# Patient Record
Sex: Male | Born: 1995 | Race: White | Hispanic: No | Marital: Single
Health system: Midwestern US, Academic
[De-identification: ages and names within clinical notes are randomized; demographics above are authoritative.]

## PROBLEM LIST (undated history)

## (undated) DIAGNOSIS — F419 Anxiety disorder, unspecified: Secondary | ICD-10-CM

## (undated) DIAGNOSIS — S060X9A Concussion with loss of consciousness of unspecified duration, initial encounter: Secondary | ICD-10-CM

## (undated) DIAGNOSIS — T7840XA Allergy, unspecified, initial encounter: Secondary | ICD-10-CM

## (undated) DIAGNOSIS — S060XAA Concussion with loss of consciousness status unknown, initial encounter: Secondary | ICD-10-CM

## (undated) HISTORY — DX: Concussion with loss of consciousness of unspecified duration, initial encounter: S06.0X9A

## (undated) HISTORY — DX: Allergy, unspecified, initial encounter: T78.40XA

## (undated) HISTORY — DX: Concussion with loss of consciousness status unknown, initial encounter: S06.0XAA

## (undated) HISTORY — PX: NO PAST SURGERIES: SHX2092

---
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
## 2017-05-30 ENCOUNTER — Encounter: Payer: Self-pay | Admitting: Physician Assistant

## 2017-05-30 ENCOUNTER — Ambulatory Visit (INDEPENDENT_AMBULATORY_CARE_PROVIDER_SITE_OTHER): Payer: BLUE CROSS/BLUE SHIELD | Admitting: Physician Assistant

## 2017-05-30 VITALS — BP 102/60 | HR 73 | Temp 99.0°F | Resp 14 | Ht 70.0 in | Wt 141.0 lb

## 2017-05-30 DIAGNOSIS — Z23 Encounter for immunization: Secondary | ICD-10-CM

## 2017-05-30 DIAGNOSIS — Z91010 Allergy to peanuts: Secondary | ICD-10-CM

## 2017-05-30 DIAGNOSIS — Z0000 Encounter for general adult medical examination without abnormal findings: Secondary | ICD-10-CM | POA: Insufficient documentation

## 2017-05-30 HISTORY — DX: Encounter for general adult medical examination without abnormal findings: Z00.00

## 2017-05-30 HISTORY — DX: Allergy to peanuts: Z91.010

## 2017-05-30 MED ORDER — EPINEPHRINE 0.3 MG/0.3ML IJ SOAJ: 0.3000 mg | Freq: Once | INTRAMUSCULAR | 1 refills | Status: AC

## 2017-05-30 NOTE — Progress Notes (Signed)
Patient presents to clinic today for annual exam.  Patient is fasting for labs.  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 Has an Epi Pen that is expired. Patient is requesting new Rx.  Chronic Issues: Generalized Anxiety Disorder Sleeping well. Notes some decrease in appetite with anxiety. Denies chest pain, palpitations, lightheadedness, SOB. Denies panic attack. Denies depressed mood but endorses mild anhedonia. Denies SI/HI.  Health Maintenance: Immunizations -- Mother has records. Will review today and abstract into chart.   Past Medical History:  Diagnosis Date  . Allergy   . Concussion     Past Surgical History:  Procedure Laterality Date  . NO PAST SURGERIES      No current outpatient prescriptions on file prior to visit.   No current facility-administered medications on file prior to visit.     Allergies  Allergen Reactions  . Peanut Oil Anaphylaxis    All tree nuts  . Other Other (See Comments)    Animal saliva, tree nuts-unknown reaction    Family History  Problem Relation Age of Onset  . Healthy Mother   . Hyperlipidemia Father   . Heart attack Maternal Grandmother   . Cancer Maternal Grandfather        Leukemia    Social History   Social History  . Marital status: Single    Spouse name: N/A  . Number of children: N/A  . Years of education: N/A   Occupational History  . Student     [employer]   Social History Main Topics  . Smoking status: Never Smoker  . Smokeless tobacco: Never Used  . Alcohol use No  . Drug use: No  . Sexual activity: Yes    Partners: Female    Birth control/ protection: Condom   Other Topics Concern  . Not on file   Social History Narrative  . No narrative on file   Review of Systems  Constitutional: Negative for fever and weight loss.  HENT: Negative for ear discharge, ear pain, hearing loss and tinnitus.   Eyes: Negative for blurred vision, double vision, photophobia and pain.    Respiratory: Negative for cough and shortness of breath.   Cardiovascular: Negative for chest pain and palpitations.  Gastrointestinal: Negative for abdominal pain, blood in stool, constipation, diarrhea, heartburn, melena, nausea and vomiting.  Genitourinary: Negative for dysuria, flank pain, frequency, hematuria and urgency.  Musculoskeletal: Negative for falls.  Neurological: Negative for dizziness, loss of consciousness and headaches.  Endo/Heme/Allergies: Negative for environmental allergies.  Psychiatric/Behavioral: Negative for depression, hallucinations, substance abuse and suicidal ideas. The patient is not nervous/anxious and does not have insomnia.     BP 102/60   Pulse 73   Temp 99 F (37.2 C) (Oral)   Resp 14   Ht 5\' 10"  (1.778 m)   Wt 141 lb (64 kg)   SpO2 98%   BMI 20.23 kg/m   Physical Exam  Constitutional: He is oriented to person, place, and time and well-developed, well-nourished, and in no distress.  HENT:  Head: Normocephalic and atraumatic.  Right Ear: External ear normal.  Left Ear: External ear normal.  Nose: Nose normal.  Mouth/Throat: Oropharynx is clear and moist. No oropharyngeal exudate.  Eyes: Pupils are equal, round, and reactive to light. Conjunctivae and EOM are normal.  Neck: Neck supple. No thyromegaly present.  Cardiovascular: Normal rate, regular rhythm, normal heart sounds and intact distal pulses.   Pulmonary/Chest: Effort normal and breath sounds normal. No respiratory distress. He has  no wheezes. He has no rales. He exhibits no tenderness.  Abdominal: Soft. Bowel sounds are normal. He exhibits no distension and no mass. There is no tenderness. There is no rebound and no guarding.  Genitourinary: Testes/scrotum normal and penis normal. No discharge found.  Lymphadenopathy:    He has no cervical adenopathy.  Neurological: He is alert and oriented to person, place, and time.  Skin: Skin is warm and dry. No rash noted.  Psychiatric: Affect  normal.  Vitals reviewed.  Assessment/Plan: Visit for preventive health examination Depression screen negative. Health Maintenance reviewed -- Immunizations abstracted. Due for Meningitis booster. Menveo given today. Also started Gardasil 9 injections today. . Preventive schedule discussed and handout given in AVS. Will obtain fasting labs today.   Peanut allergy Rx Epi Pen given.    Piedad ClimesMartin, Margi Edmundson Cody, PA-C

## 2017-05-30 NOTE — Assessment & Plan Note (Signed)
Rx Epi Pen given.

## 2017-05-30 NOTE — Patient Instructions (Signed)
Please stay well-hydrated and get plenty for rest. Eat a well-balanced diet. Keep up with exercise regimen.  Please use the handout given to call and schedule a counseling appointment. I feel this will be very beneficial to you.   We will follow-up in 3 months for reassessment of mood with counseling. If you feel you want to start a medication, please come see me sooner.    Preventive Care 18-39 Years, Male Preventive care refers to lifestyle choices and visits with your health care provider that can promote health and wellness. What does preventive care include?  A yearly physical exam. This is also called an annual well check.  Dental exams once or twice a year.  Routine eye exams. Ask your health care provider how often you should have your eyes checked.  Personal lifestyle choices, including: ? Daily care of your teeth and gums. ? Regular physical activity. ? Eating a healthy diet. ? Avoiding tobacco and drug use. ? Limiting alcohol use. ? Practicing safe sex. What happens during an annual well check? The services and screenings done by your health care provider during your annual well check will depend on your age, overall health, lifestyle risk factors, and family history of disease. Counseling Your health care provider may ask you questions about your:  Alcohol use.  Tobacco use.  Drug use.  Emotional well-being.  Home and relationship well-being.  Sexual activity.  Eating habits.  Work and work Statistician.  Screening You may have the following tests or measurements:  Height, weight, and BMI.  Blood pressure.  Lipid and cholesterol levels. These may be checked every 5 years starting at age 68.  Diabetes screening. This is done by checking your blood sugar (glucose) after you have not eaten for a while (fasting).  Skin check.  Hepatitis C blood test.  Hepatitis B blood test.  Sexually transmitted disease (STD) testing.  Discuss your test  results, treatment options, and if necessary, the need for more tests with your health care provider. Vaccines Your health care provider may recommend certain vaccines, such as:  Influenza vaccine. This is recommended every year.  Tetanus, diphtheria, and acellular pertussis (Tdap, Td) vaccine. You may need a Td booster every 10 years.  Varicella vaccine. You may need this if you have not been vaccinated.  HPV vaccine. If you are 4 or younger, you may need three doses over 6 months.  Measles, mumps, and rubella (MMR) vaccine. You may need at least one dose of MMR.You may also need a second dose.  Pneumococcal 13-valent conjugate (PCV13) vaccine. You may need this if you have certain conditions and have not been vaccinated.  Pneumococcal polysaccharide (PPSV23) vaccine. You may need one or two doses if you smoke cigarettes or if you have certain conditions.  Meningococcal vaccine. One dose is recommended if you are age 59-21 years and a first-year college student living in a residence hall, or if you have one of several medical conditions. You may also need additional booster doses.  Hepatitis A vaccine. You may need this if you have certain conditions or if you travel or work in places where you may be exposed to hepatitis A.  Hepatitis B vaccine. You may need this if you have certain conditions or if you travel or work in places where you may be exposed to hepatitis B.  Haemophilus influenzae type b (Hib) vaccine. You may need this if you have certain risk factors.  Talk to your health care provider about which screenings and  vaccines you need and how often you need them. This information is not intended to replace advice given to you by your health care provider. Make sure you discuss any questions you have with your health care provider. Document Released: 12/27/2001 Document Revised: 07/20/2016 Document Reviewed: 09/01/2015 Elsevier Interactive Patient Education  2017 [employer].

## 2017-05-30 NOTE — Assessment & Plan Note (Signed)
Depression screen negative. Health Maintenance reviewed -- Immunizations abstracted. Due for Meningitis booster. Menveo given today. Also started Gardasil 9 injections today. . Preventive schedule discussed and handout given in AVS. Will obtain fasting labs today.

## 2017-05-30 NOTE — Addendum Note (Signed)
Addended by: Con MemosMOORE, Millianna Szymborski S on: 05/30/2017 02:51 PM   Modules accepted: Orders

## 2017-05-30 NOTE — Progress Notes (Signed)
Pre visit review using our clinic review tool, if applicable. No additional management support is needed unless otherwise documented below in the visit note. 

## 2017-05-31 ENCOUNTER — Encounter: Payer: Self-pay | Admitting: Physician Assistant

## 2017-08-07 ENCOUNTER — Ambulatory Visit (INDEPENDENT_AMBULATORY_CARE_PROVIDER_SITE_OTHER): Payer: BLUE CROSS/BLUE SHIELD | Admitting: *Deleted

## 2017-08-07 DIAGNOSIS — Z23 Encounter for immunization: Secondary | ICD-10-CM

## 2018-01-19 ENCOUNTER — Ambulatory Visit: Payer: BLUE CROSS/BLUE SHIELD | Admitting: Physician Assistant

## 2018-01-19 ENCOUNTER — Encounter: Payer: Self-pay | Admitting: Physician Assistant

## 2018-01-19 ENCOUNTER — Other Ambulatory Visit: Payer: Self-pay

## 2018-01-19 VITALS — BP 100/70 | HR 86 | Temp 98.2°F | Resp 16 | Ht 70.0 in | Wt 138.0 lb

## 2018-01-19 DIAGNOSIS — R079 Chest pain, unspecified: Secondary | ICD-10-CM | POA: Diagnosis not present

## 2018-01-19 DIAGNOSIS — R0789 Other chest pain: Secondary | ICD-10-CM

## 2018-01-19 LAB — COMPREHENSIVE METABOLIC PANEL
ALK PHOS: 68 U/L (ref 52–171)
ALT: 8 U/L (ref 0–53)
AST: 14 U/L (ref 0–37)
Albumin: 5 g/dL (ref 3.5–5.2)
BUN: 10 mg/dL (ref 6–23)
CALCIUM: 10.3 mg/dL (ref 8.4–10.5)
CO2: 30 mEq/L (ref 19–32)
Chloride: 103 mEq/L (ref 96–112)
Creatinine, Ser: 0.9 mg/dL (ref 0.40–1.50)
GFR: 114.64 mL/min (ref >60.00)
Glucose, Bld: 93 mg/dL (ref 70–99)
Potassium: 4.4 mEq/L (ref 3.5–5.1)
Sodium: 139 mEq/L (ref 135–145)
TOTAL PROTEIN: 7.1 g/dL (ref 6.0–8.3)
Total Bilirubin: 0.6 mg/dL (ref 0.2–1.2)

## 2018-01-19 LAB — LIPASE: Lipase: 11 U/L (ref 11.0–59.0)

## 2018-01-19 LAB — TROPONIN I: TNIDX: 0 ug/l (ref 0.00–0.06)

## 2018-01-19 MED ORDER — GI COCKTAIL ~~LOC~~
30.0000 mL | Freq: Once | ORAL | Status: AC
  Administered 2018-01-19: 30 mL via ORAL

## 2018-01-19 MED ORDER — NAPROXEN 500 MG PO TABS
500.0000 mg | ORAL_TABLET | Freq: Two times a day (BID) | ORAL | 0 refills | Status: DC
Start: 2018-01-19 — End: 2018-08-08

## 2018-01-19 NOTE — Patient Instructions (Signed)
Please go to the lab today for blood work.  I will call you with your results. We will alter treatment regimen(s) if indicated by your results.   Your EKG looks good. You are young and healthy so there is a very low risk of this being related to your heart. Your heart and lungs sound great on examination. Symptoms seem a combination of some inflammation in the chest wall and anxiety. I want you to hydrate and rest.  Take the Naproxen as directed with food for inflammation. Avoid heavy foods or late-night eating.   Symptoms should calm down over the next day or so. Avoid heavy lifting.  If you note any worsening symptoms or shortness of breath, racing heart, please go to the ER even if our workup is negative.  Follow-up with me on Monday for reassessment.

## 2018-01-19 NOTE — Progress Notes (Signed)
Patient presents to clinic today c/o sternal chest pain starting this morning, described as aching but sharp with a deep breath. Denies SOB, palpitations, LH or dizziness. Noted some tingling in his L arm earlier that has resolved. Is having some tingling in hands intermittently. Denies nausea/vomiting or heart burn. Does note eating chinese late last night. Has been doing a lot of heavy lifting at work over the past few days. Patient does note increased stress and some anxiety recently after the birth of his child.   Past Medical History:  Diagnosis Date  . Allergy   . Concussion     Current Outpatient Medications on File Prior to Visit  Medication Sig Dispense Refill  . loratadine (CLARITIN) 10 MG tablet Take 10 mg by mouth daily.    . Multiple Vitamin (MULTIVITAMIN) tablet Take 1 tablet by mouth daily.     No current facility-administered medications on file prior to visit.     Allergies  Allergen Reactions  . Peanut Oil Anaphylaxis    All tree nuts  . Other Other (See Comments)    Animal saliva, tree nuts-unknown reaction    Family History  Problem Relation Age of Onset  . Healthy Mother   . Hyperlipidemia Father   . Heart attack Maternal Grandmother   . Cancer Maternal Grandfather        Leukemia    Social History   Socioeconomic History  . Marital status: Single    Spouse name: None  . Number of children: None  . Years of education: None  . Highest education level: None  Social Needs  . Financial resource strain: None  . Food insecurity - worry: None  . Food insecurity - inability: None  . Transportation needs - medical: None  . Transportation needs - non-medical: None  Occupational History  . Occupation: Student    Comment: [employer]  Tobacco Use  . Smoking status: Never Smoker  . Smokeless tobacco: Never Used  Substance and Sexual Activity  . Alcohol use: No  . Drug use: No  . Sexual activity: Yes    Partners: Female    Birth control/protection: Condom   Other Topics Concern  . None  Social History Narrative  . None   Review of Systems - See HPI.  All other ROS are negative.  BP 100/70   Pulse 86   Temp 98.2 F (36.8 C) (Oral)   Resp 16   Ht _0  (1.778 m)   Wt 138 lb (62.6 kg)   SpO2 97%   BMI 19.80 kg/m   Physical Exam  Constitutional: He is oriented to person, place, and time and well-developed, well-nourished, and in no distress.  HENT:  Head: Normocephalic and atraumatic.  Right Ear: External ear normal.  Left Ear: External ear normal.  Nose: Nose normal.  Mouth/Throat: Oropharynx is clear and moist.  Eyes: Conjunctivae are normal.  Neck: Neck supple.  Cardiovascular: Normal rate, regular rhythm, normal heart sounds and intact distal pulses.  Pulmonary/Chest: Effort normal and breath sounds normal. No respiratory distress. He has no wheezes. He has no rales. He exhibits tenderness.  Abdominal: Soft. Bowel sounds are normal. He exhibits no distension and no mass. There is no tenderness. There is no rebound and no guarding.  Neurological: He is alert and oriented to person, place, and time.  Skin: Skin is warm and dry. No rash noted.  Psychiatric: Affect normal.  Vitals reviewed.  Assessment/Plan: 1. Atypical chest pain EKG with NSR. GI Cocktail without improvement.  Chest tenderness on exam. Seems combination of MSK and anxiety. Patient healthy 22 year old. Low risk for ACS. Will check labs as listed below. Will start Naproxen. Stress relief tactics reviewed. Follow-up scheduled.  Strict ER precautions given to patient.  - EKG 12-Lead - gi cocktail (Maalox,Lidocaine,Donnatal) - Troponin I - Lipase - Comp Met (CMET) - naproxen (NAPROSYN) 500 MG tablet; Take 1 tablet (500 mg total) by mouth 2 (two) times daily with a meal.  Dispense: 10 tablet; Refill: 0   Leeanne Rio, PA-C

## 2018-01-22 ENCOUNTER — Other Ambulatory Visit: Payer: Self-pay

## 2018-01-22 ENCOUNTER — Encounter: Payer: Self-pay | Admitting: Physician Assistant

## 2018-01-22 ENCOUNTER — Ambulatory Visit: Payer: BLUE CROSS/BLUE SHIELD | Admitting: Physician Assistant

## 2018-01-22 VITALS — BP 106/78 | HR 89 | Temp 98.2°F | Resp 14 | Ht 70.0 in | Wt 128.0 lb

## 2018-01-22 DIAGNOSIS — F411 Generalized anxiety disorder: Secondary | ICD-10-CM | POA: Diagnosis not present

## 2018-01-22 HISTORY — DX: Generalized anxiety disorder: F41.1

## 2018-01-22 MED ORDER — ESCITALOPRAM OXALATE 10 MG PO TABS: 10.0000 mg | ORAL_TABLET | Freq: Every day | ORAL | 1 refills | Status: DC

## 2018-01-22 MED ORDER — ALPRAZOLAM 0.25 MG PO TABS: 0.2500 mg | ORAL_TABLET | Freq: Two times a day (BID) | ORAL | 0 refills | Status: DC | PRN

## 2018-01-22 NOTE — Patient Instructions (Signed)
Please go to the lab today for blood work.  I will call you with your results. We will alter treatment regimen(s) if indicated by your results.   Please start the Lexapro, taking 1/2 tablet daily x 3-4 days before increasing to 10 mg (1 tablet) daily.  Use the handout given to schedule an appointment with counseling.  Use the Xanax as directed if needed for panic attack. To be used sparingly. Download the [employer] app on your phone to do some mindfulness training techniques.   Follow-up in 1 month. Return sooner if needed. If you note any worsening symptoms, call or come see me ASAP. ER if you ever have any thoughts of harming yourself or others.

## 2018-01-22 NOTE — Assessment & Plan Note (Signed)
Significant with panic attacks. Counseling recommended. Handout given. Medication discussion was had with patient. Will start Lexapro 10 mg daily. Xanax 0.25 mg PRN for severe acute anxiety. Will check TSH today. Close follow-up scheduled.

## 2018-01-22 NOTE — Progress Notes (Signed)
Patient presents to clinic today for follow-up of atypical chest pain and anxiety. Patient endorses chest pain described at last visit has resolved. Does note chest pressure and tightness when he is getting very anxious. Notes racing thoughts at night, constantly worrying about things. Is affecting sleep and appetite. Denies depressed mood but notes some anhedonia. Denies SI/HI. States that truly he has been dealing with these issues for the past 2 years. Notes + family history of anxiety and depression. Has never been treated. Again workup at last visit including CBC, CMP, Lipase, Troponin and EKG all unremarkable.  Past Medical History:  Diagnosis Date  . Allergy   . Concussion     Current Outpatient Medications on File Prior to Visit  Medication Sig Dispense Refill  . loratadine (CLARITIN) 10 MG tablet Take 10 mg by mouth daily.    . Multiple Vitamin (MULTIVITAMIN) tablet Take 1 tablet by mouth daily.    . naproxen (NAPROSYN) 500 MG tablet Take 1 tablet (500 mg total) by mouth 2 (two) times daily with a meal. 10 tablet 0   No current facility-administered medications on file prior to visit.     Allergies  Allergen Reactions  . Peanut Oil Anaphylaxis    All tree nuts  . Other Other (See Comments)    Animal saliva, tree nuts-unknown reaction    Family History  Problem Relation Age of Onset  . Healthy Mother   . Hyperlipidemia Father   . Heart attack Maternal Grandmother   . Cancer Maternal Grandfather        Leukemia    Social History   Socioeconomic History  . Marital status: Single    Spouse name: None  . Number of children: None  . Years of education: None  . Highest education level: None  Social Needs  . Financial resource strain: None  . Food insecurity - worry: None  . Food insecurity - inability: None  . Transportation needs - medical: None  . Transportation needs - non-medical: None  Occupational History  . Occupation: Student    Comment: [employer]  Tobacco  Use  . Smoking status: Never Smoker  . Smokeless tobacco: Never Used  Substance and Sexual Activity  . Alcohol use: No  . Drug use: No  . Sexual activity: Yes    Partners: Female    Birth control/protection: Condom  Other Topics Concern  . None  Social History Narrative  . None   Review of Systems - See HPI.  All other ROS are negative.  BP 106/78   Pulse 89   Temp 98.2 F (36.8 C) (Oral)   Resp 14   Ht _0  (1.778 m)   Wt 128 lb (58.1 kg)   SpO2 98%   BMI 18.37 kg/m   Physical Exam  Constitutional: He is well-developed, well-nourished, and in no distress.  HENT:  Head: Normocephalic and atraumatic.  Eyes: Conjunctivae are normal.  Neck: Neck supple.  Cardiovascular: Normal rate, regular rhythm, normal heart sounds and intact distal pulses.  Pulmonary/Chest: Effort normal and breath sounds normal. No respiratory distress. He has no wheezes. He has no rales. He exhibits no tenderness.  Skin: Skin is warm and dry. No rash noted.  Psychiatric: His mood appears anxious. He does not exhibit a depressed mood. He expresses no homicidal and no suicidal ideation. He expresses no suicidal plans and no homicidal plans.  Vitals reviewed.  Recent Results (from the past 2160 hour(s))  Troponin I     Status: None  Collection Time: 01/19/18 10:40 AM  Result Value Ref Range   TNIDX 0.00 0.00 - 0.06 ug/l  Lipase     Status: None   Collection Time: 01/19/18 10:40 AM  Result Value Ref Range   Lipase 11.0 11.0 - 59.0 U/L  Comp Met (CMET)     Status: None   Collection Time: 01/19/18 10:40 AM  Result Value Ref Range   Sodium 139 135 - 145 mEq/L   Potassium 4.4 3.5 - 5.1 mEq/L   Chloride 103 96 - 112 mEq/L   CO2 30 19 - 32 mEq/L   Glucose, Bld 93 70 - 99 mg/dL   BUN 10 6 - 23 mg/dL   Creatinine, Ser 0.90 0.40 - 1.50 mg/dL   Total Bilirubin 0.6 0.2 - 1.2 mg/dL   Alkaline Phosphatase 68 52 - 171 U/L   AST 14 0 - 37 U/L   ALT 8 0 - 53 U/L   Total Protein 7.1 6.0 - 8.3 g/dL    Albumin 5.0 3.5 - 5.2 g/dL   Calcium 10.3 8.4 - 10.5 mg/dL   GFR 114.64 >60.00 mL/min    Assessment/Plan: Generalized anxiety disorder Significant with panic attacks. Counseling recommended. Handout given. Medication discussion was had with patient. Will start Lexapro 10 mg daily. Xanax 0.25 mg PRN for severe acute anxiety. Will check TSH today. Close follow-up scheduled.     Leeanne Rio, PA-C

## 2018-01-23 LAB — TSH: TSH: 0.75 u[IU]/mL (ref 0.40–5.00)

## 2018-01-25 ENCOUNTER — Emergency Department (HOSPITAL_COMMUNITY)
Admission: EM | Admit: 2018-01-25 | Discharge: 2018-01-25 | Discharge status: Against Medical Advice | Payer: BLUE CROSS/BLUE SHIELD | Source: Home / Self Care

## 2018-01-25 ENCOUNTER — Encounter (HOSPITAL_BASED_OUTPATIENT_CLINIC_OR_DEPARTMENT_OTHER): Payer: Self-pay

## 2018-01-25 ENCOUNTER — Emergency Department (HOSPITAL_BASED_OUTPATIENT_CLINIC_OR_DEPARTMENT_OTHER): Payer: BLUE CROSS/BLUE SHIELD

## 2018-01-25 ENCOUNTER — Ambulatory Visit: Payer: Self-pay | Admitting: *Deleted

## 2018-01-25 ENCOUNTER — Emergency Department (HOSPITAL_BASED_OUTPATIENT_CLINIC_OR_DEPARTMENT_OTHER)
Admission: EM | Admit: 2018-01-25 | Discharge: 2018-01-25 | Disposition: A | Discharge status: Home / Self Care | Payer: BLUE CROSS/BLUE SHIELD | Attending: Emergency Medicine | Admitting: Emergency Medicine

## 2018-01-25 ENCOUNTER — Other Ambulatory Visit: Payer: Self-pay

## 2018-01-25 DIAGNOSIS — R0789 Other chest pain: Secondary | ICD-10-CM | POA: Diagnosis not present

## 2018-01-25 DIAGNOSIS — R509 Fever, unspecified: Secondary | ICD-10-CM | POA: Diagnosis not present

## 2018-01-25 DIAGNOSIS — Z79899 Other long term (current) drug therapy: Secondary | ICD-10-CM | POA: Diagnosis not present

## 2018-01-25 DIAGNOSIS — R079 Chest pain, unspecified: Secondary | ICD-10-CM | POA: Diagnosis not present

## 2018-01-25 DIAGNOSIS — F419 Anxiety disorder, unspecified: Secondary | ICD-10-CM

## 2018-01-25 DIAGNOSIS — Z91010 Allergy to peanuts: Secondary | ICD-10-CM | POA: Diagnosis not present

## 2018-01-25 DIAGNOSIS — F17200 Nicotine dependence, unspecified, uncomplicated: Secondary | ICD-10-CM | POA: Diagnosis not present

## 2018-01-25 HISTORY — DX: Anxiety disorder, unspecified: F41.9

## 2018-01-25 LAB — CBC
HCT: 42.9 % (ref 39.0–52.0)
HEMOGLOBIN: 15.4 g/dL (ref 13.0–17.0)
MCH: 29.7 pg (ref 26.0–34.0)
MCHC: 35.9 g/dL (ref 30.0–36.0)
MCV: 82.7 fL (ref 78.0–100.0)
Platelets: 307 10*3/uL (ref 150–400)
RBC: 5.19 MIL/uL (ref 4.22–5.81)
RDW: 12.2 % (ref 11.5–15.5)
WBC: 9.4 10*3/uL (ref 4.0–10.5)

## 2018-01-25 LAB — BASIC METABOLIC PANEL
ANION GAP: 13 (ref 5–15)
BUN: 21 mg/dL — ABNORMAL HIGH (ref 6–20)
CALCIUM: 9.8 mg/dL (ref 8.9–10.3)
CO2: 20 mmol/L — AB (ref 22–32)
Chloride: 102 mmol/L (ref 101–111)
Creatinine, Ser: 0.92 mg/dL (ref 0.61–1.24)
GFR calc Af Amer: >60 mL/min (ref >60)
GFR calc non Af Amer: >60 mL/min (ref >60)
GLUCOSE: 94 mg/dL (ref 65–99)
Potassium: 3.7 mmol/L (ref 3.5–5.1)
Sodium: 135 mmol/L (ref 135–145)

## 2018-01-25 LAB — TROPONIN I: Troponin I: <0.03 ng/mL (ref <0.03)

## 2018-01-25 MED ORDER — KETOROLAC TROMETHAMINE 30 MG/ML IJ SOLN
30.0000 mg | Freq: Once | INTRAMUSCULAR | Status: AC
  Administered 2018-01-25: 30 mg via INTRAVENOUS
  Filled 2018-01-25: qty 1

## 2018-01-25 MED ORDER — SODIUM CHLORIDE 0.9 % IV BOLUS (SEPSIS)
500.0000 mL | Freq: Once | INTRAVENOUS | Status: AC
  Administered 2018-01-25: 500 mL via INTRAVENOUS

## 2018-01-25 MED ORDER — ONDANSETRON HCL 4 MG PO TABS: 4.0000 mg | ORAL_TABLET | Freq: Three times a day (TID) | ORAL | 0 refills | Status: DC | PRN

## 2018-01-25 NOTE — ED Provider Notes (Signed)
[facility] EMERGENCY DEPARTMENT Provider Note   CSN: [id number] Arrival date & time: 01/25/18  1521     History   Chief Complaint Chief Complaint  Patient presents with  . Chest Pain    HPI Jordan Carroll is a 22 y.o. male with a past medical history of anxiety, who presents to ED for evaluation of centralized chest pain, feelings of worsening anxiety and like his heart is "racing" for the past 6 days.  Symptoms began after he smoked a "cartilage" which is basically the wax form of marijuana.  He smoked this in the past with no symptoms.  He was seen and evaluated by his PCP when symptoms began with negative workup and was started on Lexapro to help with what appeared to be anxiety.  However, he tells me despite the use of Lexapro he continues to have symptoms.  The chest pain has been constant and does not radiate.  Described as sharp.  He does tell me that the chest pain is causing him to be more anxious and have worsening of his panic attacks where his "entire body goes numb."  He is not taking any medications prior to arrival to help with symptoms.  He does report fever of 102 today which resolved spontaneously.  Denies any cough, hemoptysis, recent surgeries, recent prolonged travel, leg swelling, hormone use, prior DVT, PE, MI, family history of cardiac disease at a young age, fever.  He reports occasional marijuana use, juul use but denies alcohol use.  HPI  Past Medical History:  Diagnosis Date  . Allergy   . Anxiety   . Concussion     Patient Active Problem List   Diagnosis Date Noted  . Generalized anxiety disorder 01/22/2018  . Visit for preventive health examination 05/30/2017  . Peanut allergy 05/30/2017  . 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 NO PAST SURGERIES         Home Medications    Prior to Admission medications   Medication Sig Start Date End Date Taking? Authorizing Provider  ALPRAZolam  (XANAX) 0.25 MG tablet Take 1 tablet (0.25 mg total) by mouth 2 (two) times daily as needed for anxiety. 01/22/18   Waldon Merl, PA-C  escitalopram (LEXAPRO) 10 MG tablet Take 1 tablet (10 mg total) by mouth daily. 01/22/18   Waldon Merl, PA-C  loratadine (CLARITIN) 10 MG tablet Take 10 mg by mouth daily.    [provider]  Multiple Vitamin (MULTIVITAMIN) tablet Take 1 tablet by mouth daily.    [provider]  naproxen (NAPROSYN) 500 MG tablet Take 1 tablet (500 mg total) by mouth 2 (two) times daily with a meal. 01/19/18   Waldon Merl, PA-C    Family History Family History  Problem Relation Age of Onset  . Healthy Mother   . Hyperlipidemia Father   . Heart attack Maternal Grandmother   . Cancer Maternal Grandfather        Leukemia    Social History Social History   Tobacco Use  . Smoking status: Current Every Day Smoker    Types: E-cigarettes  . Smokeless tobacco: Never Used  Substance Use Topics  . Alcohol use: Yes    Comment: occ  . Drug use: Yes    Types: Marijuana     Allergies   Peanut oil and Other   Review of Systems Review of Systems  Constitutional: Positive for fever. Negative for appetite change and  chills.  HENT: Negative for ear pain, rhinorrhea, sneezing and sore throat.   Eyes: Negative for photophobia and visual disturbance.  Respiratory: Negative for cough, chest tightness, shortness of breath and wheezing.   Cardiovascular: Positive for chest pain. Negative for palpitations.  Gastrointestinal: Negative for abdominal pain, blood in stool, constipation, diarrhea, nausea and vomiting.  Genitourinary: Negative for dysuria, hematuria and urgency.  Musculoskeletal: Negative for myalgias.  Skin: Negative for rash.  Neurological: Negative for dizziness, weakness and light-headedness.  Psychiatric/Behavioral: The patient is nervous/anxious.      Physical Exam Updated Vital Signs BP 119/74 (BP Location: Left Arm)    Pulse 90   Temp 98.5 F (36.9 C) (Oral)   Resp 16   Ht 5\' 10"  (1.778 m)   Wt 57.2 kg (126 lb 1.7 oz)   SpO2 98%   BMI 18.09 kg/m   Physical Exam  Constitutional: He appears well-developed and well-nourished. No distress.  Nontoxic-appearing and in no acute distress.  Speaking in complete sentences without difficulty.  Does appear very anxious.  HENT:  Head: Normocephalic and atraumatic.  Nose: Nose normal.  Eyes: Conjunctivae and EOM are normal. Left eye exhibits no discharge. No scleral icterus.  Neck: Normal range of motion. Neck supple.  Cardiovascular: Normal rate, regular rhythm, normal heart sounds and intact distal pulses. Exam reveals no gallop and no friction rub.  No murmur heard. Pulmonary/Chest: Effort normal and breath sounds normal. No respiratory distress.  Abdominal: Soft. Bowel sounds are normal. He exhibits no distension. There is no tenderness. There is no guarding.  Musculoskeletal: Normal range of motion. He exhibits no edema.  Neurological: He is alert. He exhibits normal muscle tone. Coordination normal.  Skin: Skin is warm and dry. No rash noted.  Psychiatric: He has a normal mood and affect.  Nursing note and vitals reviewed.    ED Treatments / Results  Labs (all labs ordered are listed, but only abnormal results are displayed) Labs Reviewed  BASIC METABOLIC PANEL - Abnormal; Notable for the following components:      Result Value   CO2 20 (*)    BUN 21 (*)    All other components within normal limits  CBC  TROPONIN I    EKG  EKG Interpretation  Date/Time:  Thursday January 25 2018 15:32:00 EDT Ventricular Rate:  100 PR Interval:  126 QRS Duration: 92 QT Interval:  362 QTC Calculation: 466 R Axis:   93 Text Interpretation:  Normal sinus rhythm Right atrial enlargement Rightward axis Pulmonary disease pattern Abnormal ECG No prior ECG for comparison.  No STEMI Confirmed by Theda Belfastegeler, Chris ([id number]) on 01/25/2018 3:41:42 PM        Radiology Dg Chest 2 View  Result Date: 01/25/2018 CLINICAL DATA:  Chest pain for 7 days. EXAM: CHEST - 2 VIEW COMPARISON:  None. FINDINGS: Lungs are clear. No pneumothorax or pleural effusion. Heart size is normal. No bony abnormality. IMPRESSION: Normal chest. Electronically Signed   By: Drusilla Kannerhomas  Dalessio M.D.   On: 01/25/2018 15:57    Procedures Procedures (including critical care time)  Medications Ordered in ED Medications  sodium chloride 0.9 % bolus 500 mL (0 mLs Intravenous Stopped 01/25/18 1940)  ketorolac (TORADOL) 30 MG/ML injection 30 mg (30 mg Intravenous Given 01/25/18 1857)     Initial Impression / Assessment and Plan / ED Course  I have reviewed the triage vital signs and the nursing notes.  Pertinent labs & imaging results that were available during my care of the patient were  reviewed by me and considered in my medical decision making (see chart for details).     Patient presents to ED for evaluation of 6-day history of centralized chest pain, feelings of worsening anxiety and feeling like her heart is racing.  Symptoms began after he smoked marijuana.  He was seen and evaluated by his PCP and began on Lexapro for what appeared to be anxiety.  He has been taking this for about 3 days but tells me that he has no improvement in his symptoms.  Denies any shortness of breath, hemoptysis, wheezing, leg swelling, history of DVT, PE, MI, family history of sudden cardiac death at a young age, fevers, URI symptoms, recent surgeries, recent prolonged travel.  He is afebrile with no use of antipyretics.  He is not tachycardic or tachypneic and is satting at 98% on room air without difficulty.  He does not appear dehydrated.  He does appear extremely anxious and worried.  EKG with no ischemic changes.  Troponin negative, CBC, BMP unremarkable.  Chest x-ray was negative.  He is PERC and Wells criteria negative.  He is low risk by heart score.  Patient given fluids and Toradol here with  little improvement in his symptoms.  However, I did reassure the patient that there is low suspicion for ACS, PE, any other life-threatening cardiac or pulmonary cause of his symptoms.  He is on Lexapro which could take weeks to have an effect on his symptoms.  Advised patient to continue taking Tylenol or ibuprofen as needed to help with pain and to follow-up with primary care provider for further evaluation.  I suspect that his symptoms are most likely due to his anxiety and worrying.  Patient does endorse some nausea so we will give Zofran to be taken as needed.  Patient appears stable for discharge at this time.  Strict return precautions given.  Portions of this note were generated with Scientist, clinical (histocompatibility and immunogenetics). Dictation errors may occur despite best attempts at proofreading.   Final Clinical Impressions(s) / ED Diagnoses   Final diagnoses:  Anxiety  Chest wall pain    ED Discharge Orders    None       Dietrich Pates, PA-C 01/25/18 1959    Tegeler, Canary Brim, MD 01/26/18 0005

## 2018-01-25 NOTE — Telephone Encounter (Signed)
Patient is calling to discuss the physical manifestations he is having related to his panic attack. He has started new medication and feels that he is not getting an effect yet. Patient has appointment for therapy later this month and he is talking to his pastor today. Call to office appointment scheduled with PCP for tomorrow morning. Reason for Disposition . [1] Started on anti-anxiety medication AND [2] no relief  Answer Assessment - Initial Assessment Questions 1. CONCERN: "What happened that made you call today?"     Panic attack- weight loss and palpatations 2. ANXIETY SYMPTOM SCREENING: "Can you describe how you have been feeling?"  (e.g., tense, restless, panicky, anxious, keyed up, trouble sleeping, trouble concentrating)     Patient has been diagnosed recently and started medication recently- on day 4- patient has not noticed a big difference  3. ONSET: "How long have you been feeling this way?"     Patient has had anxiety for a while- but he has just started treatment 4. RECURRENT: "Have you felt this way before?"  If yes: "What happened that time?" "What helped these feelings go away in the past?"      Patient has started to manifest symptoms- so he is seeking help 5. RISK OF HARM - SUICIDAL IDEATION:  "Do you ever have thoughts of hurting or killing yourself?"  (e.g., yes, no, no but preoccupation with thoughts about death)   - INTENT:  "Do you have thoughts of hurting or killing yourself right NOW?" (e.g., yes, no, N/A)   - PLAN: "Do you have a specific plan for how you would do this?" (e.g., gun, knife, overdose, no plan, N/A)     No- no plans to hurt self 6. RISK OF HARM - HOMICIDAL IDEATION:  "Do you ever have thoughts of hurting or killing someone else?"  (e.g., yes, no, no but preoccupation with thoughts about death)   - INTENT:  "Do you have thoughts of hurting or killing someone right NOW?" (e.g., yes, no, N/A)   - PLAN: "Do you have a specific plan for how you would do this?"  (e.g., gun, knife, no plan, N/A)      no 7. FUNCTIONAL IMPAIRMENT: "How have things been going for you overall in your life? Have you had any more difficulties than usual doing your normal daily activities?"  (e.g., better, same, worse; self-care, school, work, interactions)     New baby 8. SUPPORT: "Who is with you now?" "Who do you live with?" "Do you have family or friends nearby who you can talk to?"      parents and family- patient reports he has good support system 9. THERAPIST: "Do you have a counselor or therapist? Name?"     Has appointment for therapy- he is going to talk to pastor today 10. STRESSORS: "Has there been any new stress or recent changes in your life?"       Changes in family 8011. CAFFEINE ABUSE: "Do you drink caffeinated beverages, and how much each day?" (e.g., coffee, tea, colas)       Discussed- has not been consuming 12. SUBSTANCE ABUSE: "Do you use any illegal drugs or alcohol?"       No- patient did try smoking a "cartrage" - he felt that might have had an effect on how he felt 13. OTHER SYMPTOMS: "Do you have any other physical symptoms right now?" (e.g., chest pain, palpitations, difficulty breathing, fever)       Chest pain- comes and goes-sharp pain- weight tightening  in center of chest, not hungry back pain- body aches- neck pain 14. PREGNANCY: "Is there any chance you are pregnant?" "When was your last menstrual period?"       n/a  Protocols used: ANXIETY AND PANIC ATTACK-A-AH

## 2018-01-25 NOTE — Telephone Encounter (Signed)
Patient has checked in at the ER.

## 2018-01-25 NOTE — ED Notes (Signed)
Pt verbalizes understanding of d/c instructions and denies any further needs at this time. 

## 2018-01-25 NOTE — Discharge Instructions (Signed)
Please read attached information regarding your condition. Continue your home medications as previously prescribed. Follow-up with your primary care provider for further evaluation. Return to ED for worsening symptoms, trouble breathing or trouble swallowing, severe crushing chest pain, wheezing, coughing up blood.

## 2018-01-25 NOTE — Telephone Encounter (Signed)
Pt calling with complaints of chest pain that comes and goes since last Friday. Pt states that pain is in the middle of his chest and goes towards the left side.  Pt states that today the pain has been more constant and sometimes if feels like pressure and other times it feels like a sharp pain. Pt also has complaints of nausea and dizziness at times. Pt also stating he took his temperature and it was 102. Pt states when he looked in the mirror he could see his heart beating in the upper part of his stomach. Pt advised to seek treatment in the ED for current symptoms. Pt verbalized understanding.   Reason for Disposition . Dizziness or lightheadedness  Answer Assessment - Initial Assessment Questions 1. LOCATION: "Where does it hurt?"       Middle of chest to the left side 2. RADIATION: "Does the pain go anywhere else?" (e.g., into neck, jaw, arms, back)     No 3. ONSET: "When did the chest pain begin?" (Minutes, hours or days)      Has complaints of pain since Friday 4. PATTERN "Does the pain come and go, or has it been constant since it started?"  "Does it get worse with exertion?"      More constant today, in the past it comes and goes, worse with lying down 5. DURATION: "How long does it last" (e.g., seconds, minutes, hours)     Minutes(8/10) in the past 6. SEVERITY: "How bad is the pain?"  (e.g., Scale 1-10; mild, moderate, or severe)    - MILD (1-3): doesn't interfere with normal activities     - MODERATE (4-7): interferes with normal activities or awakens from sleep    - SEVERE (8-10): excruciating pain, unable to do any normal activities       Pain 3-4 7. CARDIAC RISK FACTORS: "Do you have any history of heart problems or risk factors for heart disease?" (e.g., prior heart attack, angina; high blood pressure, diabetes, being overweight, high cholesterol, smoking, or strong family history of heart disease)     Has smoked marijuana and cartridges, grandmother had a heart attack yearrs  ago 8. PULMONARY RISK FACTORS: "Do you have any history of lung disease?"  (e.g., blood clots in lung, asthma, emphysema, birth control pills)     no 9. CAUSE: "What do you think is causing the chest pain?"     unsure 10. OTHER SYMPTOMS: "Do you have any other symptoms?" (e.g., dizziness, nausea, vomiting, sweating, fever, difficulty breathing, cough)       Dizziness,nausea, shins to feet numb  Protocols used: CHEST PAIN-A-AH

## 2018-01-25 NOTE — ED Triage Notes (Signed)
C/o CP, heart racing x 6 days-was seen by PCP 6 days ago and again Monday-was dx with anxiety-rx lexapro-pt NAD-steady gait

## 2018-01-26 ENCOUNTER — Encounter: Payer: Self-pay | Admitting: Physician Assistant

## 2018-01-26 ENCOUNTER — Ambulatory Visit: Payer: Self-pay | Admitting: Family Medicine

## 2018-01-26 ENCOUNTER — Ambulatory Visit: Payer: BLUE CROSS/BLUE SHIELD | Admitting: Physician Assistant

## 2018-01-26 VITALS — BP 104/68 | HR 79 | Temp 98.5°F | Resp 16 | Ht 70.5 in | Wt 126.4 lb

## 2018-01-26 DIAGNOSIS — F411 Generalized anxiety disorder: Secondary | ICD-10-CM

## 2018-01-26 LAB — BASIC METABOLIC PANEL
BUN: 21 mg/dL (ref 6–23)
CALCIUM: 10 mg/dL (ref 8.4–10.5)
CHLORIDE: 103 meq/L (ref 96–112)
CO2: 26 meq/L (ref 19–32)
CREATININE: 0.93 mg/dL (ref 0.40–1.50)
GFR: 110.36 mL/min (ref >60.00)
Glucose, Bld: 96 mg/dL (ref 70–99)
Potassium: 4.1 mEq/L (ref 3.5–5.1)
Sodium: 138 mEq/L (ref 135–145)

## 2018-01-26 NOTE — Patient Instructions (Signed)
Please go to the lab today for blood work.  I will call you with your results. We will alter treatment regimen(s) if indicated by your results.    Please stay well-hydrated and get plenty of rest.  Continue the Lexapro 10 mg daily.  Pick up the prescription for the Xanax to have on hand for acute anxiety.   I want you to start a daily probiotic and start a Prilosec daily x 2 weeks.  Avoid late-night eating or heavy foods (see diet below).  Use the zofran as needed for nausea. We need you keeping food on your stomach.   Follow-up with me in 1 week.  At that time we will discuss increasing Lexapro.    Food Choices for Gastroesophageal Reflux Disease, Adult When you have gastroesophageal reflux disease (GERD), the foods you eat and your eating habits are very important. Choosing the right foods can help ease your discomfort. What guidelines do I need to follow?  Choose fruits, vegetables, whole grains, and low-fat dairy products.  Choose low-fat meat, fish, and poultry.  Limit fats such as oils, salad dressings, butter, nuts, and avocado.  Keep a food diary. This helps you identify foods that cause symptoms.  Avoid foods that cause symptoms. These may be different for everyone.  Eat small meals often instead of 3 large meals a day.  Eat your meals slowly, in a place where you are relaxed.  Limit fried foods.  Cook foods using methods other than frying.  Avoid drinking alcohol.  Avoid drinking large amounts of liquids with your meals.  Avoid bending over or lying down until 2-3 hours after eating. What foods are not recommended? These are some foods and drinks that may make your symptoms worse: Vegetables Tomatoes. Tomato juice. Tomato and spaghetti sauce. Chili peppers. Onion and garlic. Horseradish. Fruits Oranges, grapefruit, and lemon (fruit and juice). Meats High-fat meats, fish, and poultry. This includes hot dogs, ribs, ham, sausage, salami, and  bacon. Dairy Whole milk and chocolate milk. Sour cream. Cream. Butter. Ice cream. Cream cheese. Drinks Coffee and tea. Bubbly (carbonated) drinks or energy drinks. Condiments Hot sauce. Barbecue sauce. Sweets/Desserts Chocolate and cocoa. Donuts. Peppermint and spearmint. Fats and Oils High-fat foods. This includes [place] fries and potato chips. Other Vinegar. Strong spices. This includes black pepper, white pepper, red pepper, cayenne, curry powder, cloves, ginger, and chili powder. The items listed above may not be a complete list of foods and drinks to avoid. Contact your dietitian for more information. This information is not intended to replace advice given to you by your health care provider. Make sure you discuss any questions you have with your health care provider. Document Released: 05/01/2012 Document Revised: 04/07/2016 Document Reviewed: 09/04/2013 [employer]vier Interactive Patient Education  2017 [employer]

## 2018-01-26 NOTE — Progress Notes (Signed)
Patient with history of worsening GAD presents to clinic today for ER follow-up. Patient presented to ER on 01/25/18 with complaints of anxiety and atypical chest pain. ER workup included EKG, BMP (mildly decreased CO2), CBC (unremarkable), Troponin (negative), CXR (negative). Patient given IV fluids and Ketorolac for MSK chest wall pain with improvement. Patient was discharged home to follow-up today in office.   In regards to anxiety, patient was just started last week on SSRI. Is taking as directed and tolerating well overall. Has not taken any of the low-dose Xanax give for severe acute anxiety or panic. Denies SI/HI. Is noting decreased appetite and acid reflux symptoms. Notes occasional nausea without vomiting, epigastric pain or change to bowel habits.   Past Medical History:  Diagnosis Date  . Allergy   . Anxiety   . Concussion     Current Outpatient Medications on File Prior to Visit  Medication Sig Dispense Refill  . escitalopram (LEXAPRO) 10 MG tablet Take 1 tablet (10 mg total) by mouth daily. 30 tablet 1  . loratadine (CLARITIN) 10 MG tablet Take 10 mg by mouth daily.    . Multiple Vitamin (MULTIVITAMIN) tablet Take 1 tablet by mouth daily.    . naproxen (NAPROSYN) 500 MG tablet Take 1 tablet (500 mg total) by mouth 2 (two) times daily with a meal. 10 tablet 0  . ondansetron (ZOFRAN) 4 MG tablet Take 1 tablet (4 mg total) by mouth every 8 (eight) hours as needed for nausea or vomiting. 4 tablet 0  . ALPRAZolam (XANAX) 0.25 MG tablet Take 1 tablet (0.25 mg total) by mouth 2 (two) times daily as needed for anxiety. (Patient not taking: Reported on 01/26/2018) 5 tablet 0   No current facility-administered medications on file prior to visit.     Allergies  Allergen Reactions  . Peanut Oil Anaphylaxis    All tree nuts  . Other Other (See Comments)    Animal saliva, tree nuts-unknown reaction    Family History  Problem Relation Age of Onset  . Healthy Mother   .  Hyperlipidemia Father   . Heart attack Maternal Grandmother   . Cancer Maternal Grandfather        Leukemia    Social History   Socioeconomic History  . Marital status: Single    Spouse name: None  . Number of children: None  . Years of education: None  . Highest education level: None  Social Needs  . Financial resource strain: None  . Food insecurity - worry: None  . Food insecurity - inability: None  . Transportation needs - medical: None  . Transportation needs - non-medical: None  Occupational History  . Occupation: Student    Comment: [employer]  Tobacco Use  . Smoking status: Current Every Day Smoker    Types: E-cigarettes  . Smokeless tobacco: Never Used  Substance and Sexual Activity  . Alcohol use: Yes    Comment: occ  . Drug use: Yes    Types: Marijuana  . Sexual activity: None  Other Topics Concern  . None  Social History Narrative  . None   Review of Systems - See HPI.  All other ROS are negative.  BP 104/68 (BP Location: Right Arm, Patient Position: Sitting, Cuff Size: Normal)   Pulse 79   Temp 98.5 F (36.9 C) (Oral)   Resp 16   Ht 5' 10.5" (1.791 m)   Wt 126 lb 6 oz (57.3 kg)   SpO2 100%   BMI 17.88 kg/m  Physical Exam  Constitutional: He is oriented to person, place, and time and well-developed, well-nourished, and in no distress.  HENT:  Head: Normocephalic and atraumatic.  Eyes: Conjunctivae are normal.  Cardiovascular: Normal rate, regular rhythm, normal heart sounds and intact distal pulses.  Pulmonary/Chest: Effort normal and breath sounds normal. No respiratory distress. He has no wheezes. He has no rales. He exhibits no tenderness.  Abdominal: Soft. Bowel sounds are normal. He exhibits no distension and no mass. There is no tenderness. There is no rebound and no guarding.  Musculoskeletal: Normal range of motion.  Neurological: He is alert and oriented to person, place, and time.  Skin: Skin is warm and dry. No rash noted.  Psychiatric:  Affect normal.  Vitals reviewed.  Recent Results (from the past 2160 hour(s))  Troponin I     Status: None   Collection Time: 01/19/18 10:40 AM  Result Value Ref Range   TNIDX 0.00 0.00 - 0.06 ug/l  Lipase     Status: None   Collection Time: 01/19/18 10:40 AM  Result Value Ref Range   Lipase 11.0 11.0 - 59.0 U/L  Comp Met (CMET)     Status: None   Collection Time: 01/19/18 10:40 AM  Result Value Ref Range   Sodium 139 135 - 145 mEq/L   Potassium 4.4 3.5 - 5.1 mEq/L   Chloride 103 96 - 112 mEq/L   CO2 30 19 - 32 mEq/L   Glucose, Bld 93 70 - 99 mg/dL   BUN 10 6 - 23 mg/dL   Creatinine, Ser 0.90 0.40 - 1.50 mg/dL   Total Bilirubin 0.6 0.2 - 1.2 mg/dL   Alkaline Phosphatase 68 52 - 171 U/L   AST 14 0 - 37 U/L   ALT 8 0 - 53 U/L   Total Protein 7.1 6.0 - 8.3 g/dL   Albumin 5.0 3.5 - 5.2 g/dL   Calcium 10.3 8.4 - 10.5 mg/dL   GFR 114.64 >60.00 mL/min  TSH     Status: None   Collection Time: 01/22/18  1:53 PM  Result Value Ref Range   TSH 0.75 0.40 - 5.00 uIU/mL  Basic metabolic panel     Status: Abnormal   Collection Time: 01/25/18  3:41 PM  Result Value Ref Range   Sodium 135 135 - 145 mmol/L   Potassium 3.7 3.5 - 5.1 mmol/L   Chloride 102 101 - 111 mmol/L   CO2 20 (L) 22 - 32 mmol/L   Glucose, Bld 94 65 - 99 mg/dL   BUN 21 (H) 6 - 20 mg/dL   Creatinine, Ser 0.92 0.61 - 1.24 mg/dL   Calcium 9.8 8.9 - 10.3 mg/dL   GFR calc non Af Amer >60 >60 mL/min   GFR calc Af Amer >60 >60 mL/min    Comment: (NOTE) The eGFR has been calculated using the CKD EPI equation. This calculation has not been validated in all clinical situations. eGFR's persistently <60 mL/min signify possible Chronic Kidney Disease.    Anion gap 13 5 - 15    Comment: Performed at [facility], [address] [place], [place] [address]  CBC     Status: None   Collection Time: 01/25/18  3:41 PM  Result Value Ref Range   WBC 9.4 4.0 - 10.5 K/uL   RBC 5.19 4.22 - 5.81 MIL/uL   Hemoglobin 15.4  13.0 - 17.0 g/dL   HCT 42.9 39.0 - 52.0 %   MCV 82.7 78.0 - 100.0 fL  MCH 29.7 26.0 - 34.0 pg   MCHC 35.9 30.0 - 36.0 g/dL   RDW 12.2 11.5 - 15.5 %   Platelets 307 150 - 400 K/uL    Comment: Performed at [facility], [address] [place], [place] [address]  Troponin I     Status: None   Collection Time: 01/25/18  3:41 PM  Result Value Ref Range   Troponin I <0.03 <0.03 ng/mL    Comment: Performed at [facility], [address] [place], [place] [address]   Assessment/Plan: 1. Generalized anxiety disorder ER workup negative. Repeat BMP today. Will continue SSRI as it has not had time to reach a therapeutic effect. Xanax as directed. Counseling again recommended. He has appt scheduled. Will start OTC PPI to help with potential GERD. Diet discussed. Close follow-up scheduled.  - Basic metabolic panel   Leeanne Rio, PA-C

## 2018-03-12 ENCOUNTER — Ambulatory Visit: Payer: Self-pay | Admitting: Psychology

## 2018-03-21 ENCOUNTER — Other Ambulatory Visit: Payer: Self-pay | Admitting: Physician Assistant

## 2018-03-21 DIAGNOSIS — F411 Generalized anxiety disorder: Secondary | ICD-10-CM

## 2018-03-21 NOTE — Telephone Encounter (Signed)
Last OV 01/26/18, No Future OV  Last filled 01/22/18, #30 with 1 refill  Okay to refill or does the patient need a follow up?

## 2018-03-26 ENCOUNTER — Ambulatory Visit: Payer: BLUE CROSS/BLUE SHIELD | Admitting: Psychology

## 2018-03-27 ENCOUNTER — Ambulatory Visit: Payer: BLUE CROSS/BLUE SHIELD | Admitting: Physician Assistant

## 2018-04-09 ENCOUNTER — Emergency Department (HOSPITAL_BASED_OUTPATIENT_CLINIC_OR_DEPARTMENT_OTHER): Payer: BLUE CROSS/BLUE SHIELD

## 2018-04-09 ENCOUNTER — Emergency Department (HOSPITAL_BASED_OUTPATIENT_CLINIC_OR_DEPARTMENT_OTHER)
Admission: EM | Admit: 2018-04-09 | Discharge: 2018-04-09 | Disposition: A | Discharge status: Home / Self Care | Payer: BLUE CROSS/BLUE SHIELD | Attending: Emergency Medicine | Admitting: Emergency Medicine

## 2018-04-09 ENCOUNTER — Other Ambulatory Visit: Payer: Self-pay

## 2018-04-09 ENCOUNTER — Encounter (HOSPITAL_BASED_OUTPATIENT_CLINIC_OR_DEPARTMENT_OTHER): Payer: Self-pay | Admitting: *Deleted

## 2018-04-09 DIAGNOSIS — S63502A Unspecified sprain of left wrist, initial encounter: Secondary | ICD-10-CM | POA: Insufficient documentation

## 2018-04-09 DIAGNOSIS — Z79899 Other long term (current) drug therapy: Secondary | ICD-10-CM | POA: Diagnosis not present

## 2018-04-09 DIAGNOSIS — Z91010 Allergy to peanuts: Secondary | ICD-10-CM | POA: Diagnosis not present

## 2018-04-09 DIAGNOSIS — Y929 Unspecified place or not applicable: Secondary | ICD-10-CM | POA: Insufficient documentation

## 2018-04-09 DIAGNOSIS — S6992XA Unspecified injury of left wrist, hand and finger(s), initial encounter: Secondary | ICD-10-CM | POA: Diagnosis not present

## 2018-04-09 DIAGNOSIS — W2209XA Striking against other stationary object, initial encounter: Secondary | ICD-10-CM | POA: Diagnosis not present

## 2018-04-09 DIAGNOSIS — Y9311 Activity, swimming: Secondary | ICD-10-CM | POA: Insufficient documentation

## 2018-04-09 DIAGNOSIS — M25532 Pain in left wrist: Secondary | ICD-10-CM | POA: Diagnosis not present

## 2018-04-09 DIAGNOSIS — F17290 Nicotine dependence, other tobacco product, uncomplicated: Secondary | ICD-10-CM | POA: Diagnosis not present

## 2018-04-09 DIAGNOSIS — Y999 Unspecified external cause status: Secondary | ICD-10-CM | POA: Diagnosis not present

## 2018-04-09 NOTE — ED Notes (Signed)
Patient transported to X-ray 

## 2018-04-09 NOTE — ED Triage Notes (Signed)
Pt c/o left wrist injury [place] 1 [place]
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
## 2018-04-09 NOTE — Discharge Instructions (Signed)
You can take Tylenol or Ibuprofen as directed for pain. You can alternate Tylenol and Ibuprofen every 4 hours. If you take Tylenol at 1pm, then you can take Ibuprofen at 5pm. Then you can take Tylenol again at 9pm.   Follow the RICE (Rest, Ice, Compression, Elevation) protocol as directed.   Wear the splint for support and stabilization.  As we discussed, if your symptoms do not improve in approximately 1 week, follow-up with your primary care doctor return to the emergency department for repeat imaging to make sure that there was not a small fracture that was missed.  Return the emergency department sooner if you experience any worsening pain, redness or swelling of the wrist, numbness/weakness of the hands, discoloration of the fingers or any other worsening or concerning symptoms.

## 2018-04-09 NOTE — ED Notes (Signed)
ED Provider at bedside. 

## 2018-04-10 ENCOUNTER — Ambulatory Visit: Payer: BLUE CROSS/BLUE SHIELD | Admitting: Psychology

## 2018-04-18 DIAGNOSIS — S52612A Displaced fracture of left ulna styloid process, initial encounter for closed fracture: Secondary | ICD-10-CM | POA: Diagnosis not present

## 2018-04-18 DIAGNOSIS — M25532 Pain in left wrist: Secondary | ICD-10-CM | POA: Diagnosis not present

## 2018-04-25 DIAGNOSIS — M25532 Pain in left wrist: Secondary | ICD-10-CM | POA: Diagnosis not present

## 2018-04-25 DIAGNOSIS — S52615D Nondisplaced fracture of left ulna styloid process, subsequent encounter for closed fracture with routine healing: Secondary | ICD-10-CM | POA: Diagnosis not present

## 2018-05-02 DIAGNOSIS — S52615D Nondisplaced fracture of left ulna styloid process, subsequent encounter for closed fracture with routine healing: Secondary | ICD-10-CM | POA: Diagnosis not present

## 2018-05-02 DIAGNOSIS — M25532 Pain in left wrist: Secondary | ICD-10-CM | POA: Diagnosis not present

## 2018-05-16 DIAGNOSIS — M25532 Pain in left wrist: Secondary | ICD-10-CM | POA: Diagnosis not present

## 2018-05-16 DIAGNOSIS — S52615D Nondisplaced fracture of left ulna styloid process, subsequent encounter for closed fracture with routine healing: Secondary | ICD-10-CM | POA: Diagnosis not present

## 2018-06-04 DIAGNOSIS — S52615D Nondisplaced fracture of left ulna styloid process, subsequent encounter for closed fracture with routine healing: Secondary | ICD-10-CM | POA: Diagnosis not present

## 2018-06-04 DIAGNOSIS — M25532 Pain in left wrist: Secondary | ICD-10-CM | POA: Diagnosis not present

## 2018-07-12 ENCOUNTER — Other Ambulatory Visit: Payer: Self-pay | Admitting: Physician Assistant

## 2018-07-12 DIAGNOSIS — F411 Generalized anxiety disorder: Secondary | ICD-10-CM

## 2018-07-12 NOTE — Telephone Encounter (Signed)
Patient is due for a follow up appointment for Anxiety

## 2018-08-02 ENCOUNTER — Other Ambulatory Visit: Payer: Self-pay | Admitting: Physician Assistant

## 2018-08-02 DIAGNOSIS — F411 Generalized anxiety disorder: Secondary | ICD-10-CM

## 2018-08-03 NOTE — Telephone Encounter (Signed)
Patient is due for CPE and follow up on Anxiety

## 2018-08-08 ENCOUNTER — Encounter: Payer: Self-pay | Admitting: Physician Assistant

## 2018-08-08 ENCOUNTER — Ambulatory Visit: Payer: BLUE CROSS/BLUE SHIELD | Admitting: Physician Assistant

## 2018-08-08 ENCOUNTER — Other Ambulatory Visit: Payer: Self-pay

## 2018-08-08 DIAGNOSIS — F411 Generalized anxiety disorder: Secondary | ICD-10-CM | POA: Diagnosis not present

## 2018-08-08 MED ORDER — ESCITALOPRAM OXALATE 10 MG PO TABS: ORAL_TABLET | ORAL | 1 refills | Status: DC

## 2018-08-08 NOTE — Patient Instructions (Signed)
I am glad things are going so well! Continue current regimen of Lexapro 10 mg daily. Follow-up with me in 6 months for a complete physical.  Return sooner if needed.

## 2018-08-08 NOTE — Progress Notes (Signed)
Patient presents to clinic today for follow-up of generalized anxiety and depressed mood. Is currently on a regimen of Lexapro 10 mg daily. Is taking as directed. Notes doing very well on this regimen. Notes resolution of anxiety and mood is much improved. Denies SI/HI. Has quit smoking. Denies new concerns today.   Past Medical History:  Diagnosis Date  . Allergy   . Anxiety   . Concussion     Current Outpatient Medications on File Prior to Visit  Medication Sig Dispense Refill  . escitalopram (LEXAPRO) 10 MG tablet Take 1 tablet by mouth daily. Please schedule a follow up for anxiety medication 15 tablet 0  . loratadine (CLARITIN) 10 MG tablet Take 10 mg by mouth daily.    . Multiple Vitamin (MULTIVITAMIN) tablet Take 1 tablet by mouth daily.    . ondansetron (ZOFRAN) 4 MG tablet Take 1 tablet (4 mg total) by mouth every 8 (eight) hours as needed for nausea or vomiting. 4 tablet 0  . ALPRAZolam (XANAX) 0.25 MG tablet Take 1 tablet (0.25 mg total) by mouth 2 (two) times daily as needed for anxiety. (Patient not taking: Reported on 01/26/2018) 5 tablet 0   No current facility-administered medications on file prior to visit.     Allergies  Allergen Reactions  . Peanut Oil Anaphylaxis    All tree nuts  . Other Other (See Comments)    Animal saliva, tree nuts-unknown reaction    Family History  Problem Relation Age of Onset  . Healthy Mother   . Hyperlipidemia Father   . Heart attack Maternal Grandmother   . Cancer Maternal Grandfather        Leukemia    Social History   Socioeconomic History  . Marital status: Single    Spouse name: Not on file  . Number of children: Not on file  . Years of education: Not on file  . Highest education level: Not on file  Occupational History  . Occupation: Consulting civil engineer    Comment: [employer]  Social Needs  . Financial resource strain: Not on file  . Food insecurity:    Worry: Not on file    Inability: Not on file  . Transportation needs:   Medical: Not on file    Non-medical: Not on file  Tobacco Use  . Smoking status: Former Smoker    Types: E-cigarettes    Last attempt to quit: 01/22/2018    Years since quitting: 0.5  . Smokeless tobacco: Never Used  Substance and Sexual Activity  . Alcohol use: Yes    Comment: occ  . Drug use: Yes    Types: Marijuana  . Sexual activity: Not on file  Lifestyle  . Physical activity:    Days per week: Not on file    Minutes per session: Not on file  . Stress: Not on file  Relationships  . Social connections:    Talks on phone: Not on file    Gets together: Not on file    Attends religious service: Not on file    Active member of club or organization: Not on file    Attends meetings of clubs or organizations: Not on file    Relationship status: Not on file  Other Topics Concern  . Not on file  Social History Narrative  . Not on file   Review of Systems - See HPI.  All other ROS are negative.  BP 116/60   Pulse 68   Temp 97.8 F (36.6 C) (Oral)  Resp 14   Ht 5\' 10"  (1.778 m)   Wt 170 lb (77.1 kg)   SpO2 98%   BMI 24.39 kg/m   Physical Exam  Constitutional: He is oriented to person, place, and time. He appears well-developed and well-nourished.  HENT:  Head: Normocephalic and atraumatic.  Eyes: Conjunctivae are normal.  Neck: Neck supple.  Cardiovascular: Normal rate, regular rhythm, normal heart sounds and intact distal pulses.  Pulmonary/Chest: Effort normal and breath sounds normal.  Neurological: He is alert and oriented to person, place, and time.  Psychiatric: He has a normal mood and affect.  Vitals reviewed.  Assessment/Plan: 1. Generalized anxiety disorder Doing very well. Will continue current regimen. Follow-up 6 months for CPE. - escitalopram (LEXAPRO) 10 MG tablet; Take 1 tablet by mouth daily. Please schedule a follow up for anxiety medication  Dispense: 90 tablet; Refill: 1   Jordan Carroll, [place]

## 2019-02-06 ENCOUNTER — Other Ambulatory Visit: Payer: Self-pay | Admitting: Physician Assistant

## 2019-02-06 DIAGNOSIS — F411 Generalized anxiety disorder: Secondary | ICD-10-CM

## 2019-02-06 NOTE — Telephone Encounter (Signed)
Patient is due for follow up appointment in 6 months for recheck of Anxiety. Will contact patient to schedule a video visit.

## 2019-02-07 NOTE — Telephone Encounter (Signed)
Called patient but VM was not set up. Unable to leave a message to schedule a video visit for follow up on Anxiety.

## 2019-02-08 NOTE — Telephone Encounter (Signed)
Patient called and left message to check status on refill [phone] (mobile)

## 2019-03-07 ENCOUNTER — Other Ambulatory Visit: Payer: Self-pay | Admitting: Physician Assistant

## 2019-03-07 DIAGNOSIS — F411 Generalized anxiety disorder: Secondary | ICD-10-CM

## 2019-03-20 ENCOUNTER — Other Ambulatory Visit: Payer: Self-pay

## 2019-03-20 ENCOUNTER — Ambulatory Visit (INDEPENDENT_AMBULATORY_CARE_PROVIDER_SITE_OTHER): Payer: BLUE CROSS/BLUE SHIELD | Admitting: Physician Assistant

## 2019-03-20 ENCOUNTER — Encounter: Payer: Self-pay | Admitting: Physician Assistant

## 2019-03-20 DIAGNOSIS — F411 Generalized anxiety disorder: Secondary | ICD-10-CM | POA: Diagnosis not present

## 2019-03-20 MED ORDER — ESCITALOPRAM OXALATE 10 MG PO TABS: ORAL_TABLET | ORAL | 1 refills | Status: DC

## 2019-03-20 NOTE — Progress Notes (Signed)
I have discussed the procedure for the virtual visit with the patient who has given consent to proceed with assessment and treatment.   Thoren Hosang S Klaus Casteneda, CMA     

## 2019-03-20 NOTE — Progress Notes (Signed)
   Virtual Visit via Video   I connected with patient on 03/20/19 at  8:00 AM EDT by a video enabled telemedicine application and verified that I am speaking with the correct person using two identifiers.  Location patient: Home Location provider: [facility], [facility] Persons participating in the virtual visit: Patient, Provider, CMA (Patina Moore)  I discussed the limitations of evaluation and management by telemedicine and the availability of in person appointments. The patient expressed understanding and agreed to proceed.  Subjective:   HPI:   Patient presents via [employer] for follow-up of anxiety/depression. Patient is currently on a regimen of Lexapro 10 . Endorses mood is well-controlled. Is sleeping well. Appetite is good but not excessive. Is keeping active. Denies breakthrough symptoms with current regimen.   Denies new concerns today.  ROS:   See pertinent positives and negatives per HPI.  Patient Active Problem List   Diagnosis Date Noted  . Generalized anxiety disorder 01/22/2018  . Visit for preventive health examination 05/30/2017  . Peanut allergy 05/30/2017    Social History   Tobacco Use  . Smoking status: Former Smoker    Types: E-cigarettes    Last attempt to quit: 01/22/2018    Years since quitting: 1.1  . Smokeless tobacco: Never Used  Substance Use Topics  . Alcohol use: Yes    Comment: occ    Current Outpatient Medications:  .  escitalopram (LEXAPRO) 10 MG tablet, TAKE 1 TABLET BY MOUTH EVERY DAY. Call the office to schedule a video appointment for more refills, Disp: 30 tablet, Rfl: 0 .  loratadine (CLARITIN) 10 MG tablet, Take 10 mg by mouth daily., Disp: , Rfl:   Allergies  Allergen Reactions  . Peanut Oil Anaphylaxis    All tree nuts  . Other Other (See Comments)    Animal saliva, tree nuts-unknown reaction    Objective:   There were no vitals taken for this visit.  Patient is well-developed, well-nourished in no acute  distress.  Resting comfortably at home.  Head is normocephalic, atraumatic.  No labored breathing.  Speech is clear and coherent with logical content.  Patient is alert and oriented at baseline.   Assessment and Plan:   Generalized anxiety disorder Doing very well. Continue current regimen. Medications refilled. Follow-up in 6 months for CPE.     Piedad Climes, PA-C 03/20/2019

## 2019-03-20 NOTE — Assessment & Plan Note (Signed)
Doing very well. Continue current regimen. Medications refilled. Follow-up in 6 months for CPE.

## 2019-08-12 ENCOUNTER — Institutional Professional Consult (permissible substitution): Admit: 2019-08-12 | Payer: PRIVATE HEALTH INSURANCE

## 2019-08-12 DIAGNOSIS — Z20828 Contact with and (suspected) exposure to other viral communicable diseases: Secondary | ICD-10-CM

## 2019-08-12 LAB — 2019 NOVEL CORONAVIRUS (COVID-19), NAA-B: SARS-CoV-2: NOT DETECTED

## 2019-08-12 NOTE — Progress Notes (Signed)
2019 Novel Coronavirus, NAAT-B (Nasal Self-Swab Specimen) - Sent to [facility] [phone])

## 2019-08-12 NOTE — Telephone Encounter (Signed)
CHIEF COMPLAINT:   COVID-related concern: needs a test.  has been exposed      Date of onset of COVID-19 Symptoms     [x]  No symptoms   []  Date:     Major symptoms    Notify provider now if   [x]  None    []  NEW onset cough    []  NEW onset shortness of breath [] Shortness of breath at rest or worsening shortness of breath    []  Loss of smell    []  Loss of taste       Minor symptoms  [x]  None Notify provider now if Orders   []  Fever (measured 100.4 or greater, or subjective) [] Fever with rash, stiff neck, confusion or severe headache     []  New aches and pains      []  Sore throat [] Unable to swallow liquids/saliva, difficulty breathing due to swelling in throat [] Strep [id number]   [] Mono POC64 if sore throat 7 days or more   []  Chills     []  Headache     []  Fatigue     Does patient meet criteria for possible COVID-19?  (at least 1 major symptom and/or 2 or more minor symptoms)  []  YES   [x]  NO     DOES THE PATIENT HAVE PRESENT SYMPTOMS OF SEVERE ILLNESS?  []  None   []  Trouble breathing at rest    []  Unable to take fluids by mouth   []  Changes in mental status/confused   []  Other symptom of possible serious illness: __________________________________________     COVID-19 EXPOSURE?  []  None   [x]  Close contact (within 6 feet) with a patient who has a confirmed case of COVID-19 for 15 minutes or greater:  Date most recent contact occurred: __09/21/2020___________________     Have you been instructed by a medical professional to isolate or quarantine?ate this sarted  [x]  No   []  Yes (document the date this started and present location patient is isolating or quarantining):         Prior COVID testing   [x]  None   []  Yes (document test date, location and result [positive, negative or pending]): ______________     Housing  []  [facility] (specify):  [] [address] [] [employer] [] [place]   [] [facility] [] [facility] [] [facility]   [] [facility] [] [facility] [] [facility]   [] [address] [] [address]  [facility] [] [employer] [facility]   [] Graduate [] [address] [] [address]      [x]  Not in [facility]           Triage plan:  []  POSSIBLE EMERGENCY (mental status changes, difficulty speaking due to shortness of breath or other emergency)  Tell patient to call 911 (call on their behalf if not able)   []  Patient with worsening symptoms and or shortness of breath:   Patient scheduled for telehealth appointment today or note routed to provider as urgent if no appointments available   []  Patient meets criteria for possible COVID-19 without severe illness  1. Ordering COVID test if not already done    2. Patient told to complete COVID Check app entry if not done yet (if having trouble with the app, they can also enter their information into [employer] at the following website: https://redcap.research.meratolhellas.com).    Note: information about the app and the [employer]cap link is on this page: http://www.booker.com/     3. Patient told to isolate and given instructions: isolation is 10 days from onset of symptoms (or +  COVID test if no symptoms) and 24 hours of improving symptoms and no fever  4. Patient told to call if symptoms worsen, including shortness of breath, confusion or difficulty taking oral hydration  5. Advised patient to have close contacts such as roommates to limit contact with others, contact the COVID Check app, get COVID testing (if contacts are [employer] students, they can call [employer] at [phone])  6. Housing plan:    []  [facility]  ents  [facility] If patient has either not entered information into COVID Check/COVIDWatch or has been waiting for over 12 hours for a response please notify Dr Marolyn Haller through secure epic chat that patient needs transfer to isolation dorm   []  Not [facility] Patient encouraged to stay in a location with their own bedroom and bathroom away from others who are at high risk of COVID complications      [x]  Patient had  significant contact with someone with COVID-19 but no symptoms  1. Patient offered COVID-19 testing  2. Patient told to complete COVID Check app entry if not done yet (if having trouble with the app, they can also enter their information into [employer] at the following website: https://redcap.research.meratolhellas.com).  Note: information about the app and the redcap link is on this page: http://www.booker.com/  3. Patient told to quarantine and given instructions below.  Quarantine period is 14 days after most recent exposure to COVID+ person  4. Patient told to call if symptoms develop   5. Housing plan: if they are on campus they will likely need to be moved to a different location.  They will receive guidance after they send their information to the COVID Check app (or COVIDWatch)       []  Patient would like telemedicine visit     1. If you can't route the call immediately to [facility] staff, tell patient they will be contacted to schedule an appointment shortly   2. Route encounter to clerical pool.        Telephone Visit Instructions for Patients  1. We may be billing their insurance for the time spent in consultation  2.   Be available by phone at the time of their scheduled visit.         Home Isolation/Quarantine Instructions for patients with...  symptoms of COVID-19 in the past 14 days  or potential exposure to COVID-19 in the past 14 days  Stay home     Separate yourself from other people in your home      Call ahead before visiting your doctor     Wear a facemask and have others wear a mask if in the same room    Cover your coughs and sneezes with a tissue or your elbow, then wash your hands    Wash your hands for at least 20 seconds or use sanitizer    Avoid sharing household items       Monitor your symptoms   Call if you have new or worsening symptoms (such as difficulty breathing).

## 2019-08-14 ENCOUNTER — Ambulatory Visit: Admit: 2019-08-14 | Payer: PRIVATE HEALTH INSURANCE

## 2019-08-14 DIAGNOSIS — Z20828 Contact with and (suspected) exposure to other viral communicable diseases: Secondary | ICD-10-CM

## 2019-08-23 DIAGNOSIS — Z20828 Contact with and (suspected) exposure to other viral communicable diseases: Secondary | ICD-10-CM | POA: Diagnosis not present

## 2019-08-23 DIAGNOSIS — R5383 Other fatigue: Secondary | ICD-10-CM | POA: Diagnosis not present

## 2019-08-23 DIAGNOSIS — R52 Pain, unspecified: Secondary | ICD-10-CM | POA: Diagnosis not present

## 2019-09-06 ENCOUNTER — Other Ambulatory Visit: Payer: Self-pay | Admitting: Physician Assistant

## 2019-09-06 ENCOUNTER — Encounter: Payer: Self-pay | Admitting: Physician Assistant

## 2019-09-06 ENCOUNTER — Other Ambulatory Visit: Payer: Self-pay

## 2019-09-06 ENCOUNTER — Ambulatory Visit (INDEPENDENT_AMBULATORY_CARE_PROVIDER_SITE_OTHER): Payer: BC Managed Care – PPO | Admitting: Physician Assistant

## 2019-09-06 VITALS — Temp 98.1°F | Wt 161.1 lb

## 2019-09-06 DIAGNOSIS — F411 Generalized anxiety disorder: Secondary | ICD-10-CM

## 2019-09-06 MED ORDER — ESCITALOPRAM OXALATE 20 MG PO TABS: 20.0000 mg | ORAL_TABLET | Freq: Every day | ORAL | 3 refills | Status: DC

## 2019-09-06 NOTE — Progress Notes (Signed)
   Virtual Visit via Video   I connected with patient on 09/06/19 at  9:00 AM EDT by a video enabled telemedicine application and verified that I am speaking with the correct person using two identifiers.  Location patient: Home Location provider: [facility], Office Persons participating in the virtual visit: Patient, Provider, [place] (Jordan Carroll)  I discussed the limitations of evaluation and management by telemedicine and the availability of in person appointments. The patient expressed understanding and agreed to proceed.  Subjective:   HPI:   Patient presents via [employer] today for follow-up of GAD. Patient is currently on a regimen of Lexapro 10 mg QD which he has done well on for some time. Endorses taking medications daily as directed without side effect. Over the past few weeks has been noting some increased stressors causing difficulty with focus and shutting his mind off at night. Has quit vaping recently and feels it was helping with his anxiety. Notes some financial stressors, has a young child at home. Denies depressed mood or anhedonia. Denies change to appetite.   ROS:   See pertinent positives and negatives per HPI.  Patient Active Problem List   Diagnosis Date Noted  . Generalized anxiety disorder 01/22/2018  . Visit for preventive health examination 05/30/2017  . Peanut allergy 05/30/2017    Social History   Tobacco Use  . Smoking status: Former Smoker    Types: E-cigarettes    Quit date: 01/22/2018    Years since quitting: 1.6  . Smokeless tobacco: Never Used  Substance Use Topics  . Alcohol use: Yes    Comment: occ    Current Outpatient Medications:  .  escitalopram (LEXAPRO) 10 MG tablet, TAKE 1 TABLET BY MOUTH EVERY DAY., Disp: 90 tablet, Rfl: 1 .  loratadine (CLARITIN) 10 MG tablet, Take 10 mg by mouth daily., Disp: , Rfl:  .  EPINEPHrine 0.3 mg/0.3 mL IJ SOAJ injection, Inject into the muscle., Disp: , Rfl:   Allergies  Allergen Reactions   . Peanut Oil Anaphylaxis    All tree nuts  . Other Other (See Comments)    Animal saliva, tree nuts-unknown reaction    Objective:   Temp 98.1 F (36.7 C) (Temporal)   Wt 161 lb 1.6 oz (73.1 kg)   BMI 23.12 kg/m   Patient is well-developed, well-nourished in no acute distress.  Resting comfortably at home.  Head is normocephalic, atraumatic.  No labored breathing.  Speech is clear and coherent with logical content.  Patient is alert and oriented at baseline.   Assessment and Plan:   1. Generalized anxiety disorder Increased stressors with deterioration in anxiety. Will increase Lexapro to 20 mg daily. Stress relief tactics reviewed. Will follow-up via video visit in 6 weeks. Sooner if patient needs.   Leeanne Rio, PA-C 09/06/2019

## 2019-09-06 NOTE — Progress Notes (Signed)
I have discussed the procedure for the virtual visit with the patient who has given consent to proceed with assessment and treatment.   Mirella Gueye S Shakeya Kerkman, CMA     

## 2019-09-30 ENCOUNTER — Other Ambulatory Visit: Payer: Self-pay | Admitting: Physician Assistant

## 2019-09-30 DIAGNOSIS — F411 Generalized anxiety disorder: Secondary | ICD-10-CM

## 2019-09-30 NOTE — Telephone Encounter (Signed)
Please advise is pt currently on 10 or 20mg ?

## 2019-11-30 DIAGNOSIS — Z20822 Contact with and (suspected) exposure to covid-19: Secondary | ICD-10-CM | POA: Diagnosis not present

## 2019-11-30 DIAGNOSIS — R509 Fever, unspecified: Secondary | ICD-10-CM | POA: Diagnosis not present

## 2019-11-30 DIAGNOSIS — M791 Myalgia, unspecified site: Secondary | ICD-10-CM | POA: Diagnosis not present

## 2020-02-23 ENCOUNTER — Ambulatory Visit: Admit: 2020-02-23 | Payer: PRIVATE HEALTH INSURANCE

## 2020-02-23 DIAGNOSIS — Z23 Encounter for immunization: Secondary | ICD-10-CM

## 2020-04-28 DIAGNOSIS — Z20822 Contact with and (suspected) exposure to covid-19: Secondary | ICD-10-CM | POA: Diagnosis not present

## 2020-10-30 DIAGNOSIS — J029 Acute pharyngitis, unspecified: Secondary | ICD-10-CM | POA: Diagnosis not present

## 2020-10-30 DIAGNOSIS — J00 Acute nasopharyngitis [common cold]: Secondary | ICD-10-CM | POA: Diagnosis not present

## 2021-02-23 ENCOUNTER — Other Ambulatory Visit: Payer: Self-pay

## 2021-02-23 ENCOUNTER — Encounter: Payer: Self-pay | Admitting: Registered Nurse

## 2021-02-23 ENCOUNTER — Ambulatory Visit (INDEPENDENT_AMBULATORY_CARE_PROVIDER_SITE_OTHER): Payer: BC Managed Care – PPO | Admitting: Registered Nurse

## 2021-02-23 VITALS — BP 128/76 | HR 75 | Temp 98.4°F | Resp 16 | Ht 70.0 in | Wt 157.6 lb

## 2021-02-23 DIAGNOSIS — F411 Generalized anxiety disorder: Secondary | ICD-10-CM

## 2021-02-23 DIAGNOSIS — J4520 Mild intermittent asthma, uncomplicated: Secondary | ICD-10-CM | POA: Diagnosis not present

## 2021-02-23 MED ORDER — ESCITALOPRAM OXALATE 10 MG PO TABS: 10.0000 mg | ORAL_TABLET | Freq: Every day | ORAL | 0 refills | Status: DC

## 2021-02-23 MED ORDER — ALBUTEROL SULFATE HFA 108 (90 BASE) MCG/ACT IN AERS: 2.0000 | INHALATION_SPRAY | Freq: Four times a day (QID) | RESPIRATORY_TRACT | 5 refills | Status: DC | PRN

## 2021-02-23 MED ORDER — HYDROXYZINE HCL 10 MG PO TABS: 5.0000 mg | ORAL_TABLET | Freq: Three times a day (TID) | ORAL | 0 refills | Status: DC | PRN

## 2021-02-23 NOTE — Progress Notes (Signed)
Established Patient Office Visit  Subjective:  Patient ID: Jordan Carroll, male    DOB: 1995-12-05  Age: 25 y.o. MRN: [id number]  CC:  Chief Complaint  Patient presents with  . Anxiety    Pt has been doing okay, has stopped Lexapro about 6 months ago.   . Allergies    Pt reports especially bad seasonal allergies this year, sneezing watery eyes, runny nose, not constant, does work as a Administrator. Wondered about stronger allergy relief     HPI Jordan Carroll presents for anxiety  Ongoing for a few years - since he became a father Had been on Lexapro until about 6 mo ago, he was feeling well overall so he decided to stop taking this. No withdrawal but noted higher anxiety and some panic attacks. Interested in restarting treatment.  Allergies: Worse this year. Wheezing when at work. No swelling in throat or upper respiratory isuses. Takes zyrtec daily which helps somewhat, but certain tasks at work still lead to wheezing. Had COVID in summer 2021, but otherwise no hx of respiratory issues.   Past Medical History:  Diagnosis Date  . Allergy   . Anxiety   . Concussion     Past Surgical History:  Procedure Laterality Date  . NO PAST SURGERIES      Family History  Problem Relation Age of Onset  . Healthy Mother   . Hyperlipidemia Father   . Heart attack Maternal Grandmother   . Cancer Maternal Grandfather        Leukemia    Social History   Socioeconomic History  . Marital status: Single    Spouse name: Not on file  . Number of children: Not on file  . Years of education: Not on file  . Highest education level: Not on file  Occupational History  . Occupation: Student    Comment: [employer]  Tobacco Use  . Smoking status: Former Smoker    Types: E-cigarettes    Quit date: 01/22/2018    Years since quitting: 3.0  . Smokeless tobacco: Never Used  Vaping Use  . Vaping Use: Never used  Substance and Sexual Activity  . Alcohol use: Yes    Comment: occ  . Drug use: Yes     Types: Marijuana  . Sexual activity: Not on file  Other Topics Concern  . Not on file  Social History Narrative  . Not on file   Social Determinants of Health   Financial Resource Strain: Not on file  Food Insecurity: Not on file  Transportation Needs: Not on file  Physical Activity: Not on file  Stress: Not on file  Social Connections: Not on file  Intimate Partner Violence: Not on file    Outpatient Medications Prior to Visit  Medication Sig Dispense Refill  . EPINEPHrine 0.3 mg/0.3 mL IJ SOAJ injection Inject into the muscle.    . loratadine (CLARITIN) 10 MG tablet Take 10 mg by mouth daily.    Marland Kitchen escitalopram (LEXAPRO) 20 MG tablet Take 1 tablet (20 mg total) by mouth daily. TAKE 1 TABLET BY MOUTH EVERY DAY. 90 tablet 0   No facility-administered medications prior to visit.    Allergies  Allergen Reactions  . Peanut Oil Anaphylaxis    All tree nuts  . Other Other (See Comments)    Animal saliva, tree nuts-unknown reaction    ROS Review of Systems Per hpi     Objective:    Physical Exam Constitutional:      General:  He is not in acute distress.    Appearance: Normal appearance. He is normal weight. He is not ill-appearing, toxic-appearing or diaphoretic.  Cardiovascular:     Rate and Rhythm: Normal rate and regular rhythm.     Heart sounds: Normal heart sounds. No murmur heard. No friction rub. No gallop.   Pulmonary:     Effort: Pulmonary effort is normal. No respiratory distress.     Breath sounds: Normal breath sounds. No stridor. No wheezing, rhonchi or rales.  Chest:     Chest wall: No tenderness.  Neurological:     General: No focal deficit present.     Mental Status: He is alert and oriented to person, place, and time. Mental status is at baseline.  Psychiatric:        Mood and Affect: Mood normal.        Behavior: Behavior normal.        Thought Content: Thought content normal.        Judgment: Judgment normal.     BP 128/76   Pulse 75    Temp 98.4 F (36.9 C) (Temporal)   Resp 16   Ht 5\' 10"  (1.778 m)   Wt 157 lb 9.6 oz (71.5 kg)   SpO2 99%   BMI 22.61 kg/m  Wt Readings from Last 3 Encounters:  02/23/21 157 lb 9.6 oz (71.5 kg)  09/06/19 161 lb 1.6 oz (73.1 kg)  08/08/18 170 lb (77.1 kg)     Health Maintenance Due  Topic Date Due  . HPV VACCINES (3 - Male 3-dose series) 12/07/2017  . TETANUS/TDAP  05/24/2020       Topic Date Due  . HPV VACCINES (3 - Male 3-dose series) 12/07/2017    Lab Results  Component Value Date   TSH 0.75 01/22/2018   Lab Results  Component Value Date   WBC 9.4 01/25/2018   HGB 15.4 01/25/2018   HCT 42.9 01/25/2018   MCV 82.7 01/25/2018   PLT 307 01/25/2018   Lab Results  Component Value Date   NA 138 01/26/2018   K 4.1 01/26/2018   CO2 26 01/26/2018   GLUCOSE 96 01/26/2018   BUN 21 01/26/2018   CREATININE 0.93 01/26/2018   BILITOT 0.6 01/19/2018   ALKPHOS 68 01/19/2018   AST 14 01/19/2018   ALT 8 01/19/2018   PROT 7.1 01/19/2018   ALBUMIN 5.0 01/19/2018   CALCIUM 10.0 01/26/2018   ANIONGAP 13 01/25/2018   GFR 110.36 01/26/2018   No results found for: CHOL No results found for: HDL No results found for: LDLCALC No results found for: TRIG No results found for: CHOLHDL No results found for: 01/28/2018    Assessment & Plan:   Problem List Items Addressed This Visit      Other   Generalized anxiety disorder   Relevant Medications   escitalopram (LEXAPRO) 10 MG tablet   hydrOXYzine (ATARAX/VISTARIL) 10 MG tablet   Other Relevant Orders   Ambulatory referral to Psychology    Other Visit Diagnoses    Mild intermittent extrinsic asthma without complication    -  Primary   Relevant Medications   albuterol (VENTOLIN HFA) 108 (90 Base) MCG/ACT inhaler      Meds ordered this encounter  Medications  . escitalopram (LEXAPRO) 10 MG tablet    Sig: Take 1 tablet (10 mg total) by mouth at bedtime. Increase to 2 tablets (20 mg total) by mouth at bedtime after 1 week.     Dispense:  90 tablet  Refill:  0    Order Specific Question:   Supervising Provider    Answer:   Neva Seat, JEFFREY R [[id number]]  . albuterol (VENTOLIN HFA) 108 (90 Base) MCG/ACT inhaler    Sig: Inhale 2 puffs into the lungs every 6 (six) hours as needed for wheezing or shortness of breath.    Dispense:  18 g    Refill:  5    Order Specific Question:   Supervising Provider    Answer:   Neva Seat, JEFFREY R [[id number]]  . hydrOXYzine (ATARAX/VISTARIL) 10 MG tablet    Sig: Take 0.5-1 tablets (5-10 mg total) by mouth 3 (three) times daily as needed for anxiety.    Dispense:  30 tablet    Refill:  0    Order Specific Question:   Supervising Provider    Answer:   Neva Seat, JEFFREY R [[id number]]    Follow-up: Return in about 6 weeks (around 04/06/2021) for med check.   PLAN  Restart lexapro at 10mg . Increase to 20mg  after one week  Refer to counseling  Hydroxyzine 5-10mg  po tid prn for breakthrough anxiety  Albuterol for allergic asthma. Can consider montelukast, however, some concern for psychological side effects. May also consider referral to allergy/asthma if albuterol ineffective.  Return in 6 w for med check, sooner with concerns  Patient encouraged to call clinic with any questions, comments, or concerns.  , NP

## 2021-02-28 ENCOUNTER — Encounter: Payer: Self-pay | Admitting: Registered Nurse

## 2021-03-01 ENCOUNTER — Other Ambulatory Visit: Payer: Self-pay | Admitting: Registered Nurse

## 2021-03-01 DIAGNOSIS — R002 Palpitations: Secondary | ICD-10-CM

## 2021-03-01 NOTE — Telephone Encounter (Signed)
Pt seen other day for Anxiety depression issues, states heart racing possible palpitations through night time requesting Cards referral Pended below

## 2021-03-04 ENCOUNTER — Emergency Department (HOSPITAL_BASED_OUTPATIENT_CLINIC_OR_DEPARTMENT_OTHER)
Admission: EM | Admit: 2021-03-04 | Discharge: 2021-03-04 | Disposition: A | Discharge status: Home / Self Care | Payer: BC Managed Care – PPO | Attending: Emergency Medicine | Admitting: Emergency Medicine

## 2021-03-04 ENCOUNTER — Emergency Department (HOSPITAL_BASED_OUTPATIENT_CLINIC_OR_DEPARTMENT_OTHER): Payer: BC Managed Care – PPO

## 2021-03-04 ENCOUNTER — Other Ambulatory Visit: Payer: Self-pay

## 2021-03-04 ENCOUNTER — Encounter (HOSPITAL_BASED_OUTPATIENT_CLINIC_OR_DEPARTMENT_OTHER): Payer: Self-pay | Admitting: Emergency Medicine

## 2021-03-04 DIAGNOSIS — F419 Anxiety disorder, unspecified: Secondary | ICD-10-CM | POA: Diagnosis not present

## 2021-03-04 DIAGNOSIS — Z87891 Personal history of nicotine dependence: Secondary | ICD-10-CM | POA: Diagnosis not present

## 2021-03-04 DIAGNOSIS — R079 Chest pain, unspecified: Secondary | ICD-10-CM | POA: Insufficient documentation

## 2021-03-04 DIAGNOSIS — Z8616 Personal history of COVID-19: Secondary | ICD-10-CM | POA: Diagnosis not present

## 2021-03-04 DIAGNOSIS — R002 Palpitations: Secondary | ICD-10-CM | POA: Diagnosis not present

## 2021-03-04 LAB — CBC
HCT: 44.4 % (ref 39.0–52.0)
Hemoglobin: 15.2 g/dL (ref 13.0–17.0)
MCH: 29.1 pg (ref 26.0–34.0)
MCHC: 34.2 g/dL (ref 30.0–36.0)
MCV: 85.1 fL (ref 80.0–100.0)
Platelets: 286 10*3/uL (ref 150–400)
RBC: 5.22 MIL/uL (ref 4.22–5.81)
RDW: 11.9 % (ref 11.5–15.5)
WBC: 6.3 10*3/uL (ref 4.0–10.5)
nRBC: 0 % (ref 0.0–0.2)

## 2021-03-04 LAB — BASIC METABOLIC PANEL
Anion gap: 8 (ref 5–15)
BUN: 16 mg/dL (ref 6–20)
CO2: 27 mmol/L (ref 22–32)
Calcium: 9.3 mg/dL (ref 8.9–10.3)
Chloride: 102 mmol/L (ref 98–111)
Creatinine, Ser: 1 mg/dL (ref 0.61–1.24)
GFR, Estimated: >60 mL/min (ref >60)
Glucose, Bld: 86 mg/dL (ref 70–99)
Potassium: 4 mmol/L (ref 3.5–5.1)
Sodium: 137 mmol/L (ref 135–145)

## 2021-03-04 LAB — TSH: TSH: 1.021 u[IU]/mL (ref 0.350–4.500)

## 2021-03-04 NOTE — ED Provider Notes (Signed)
[facility] EMERGENCY DEPARTMENT Provider Note   CSN: [id number] Arrival date & time: 03/04/21  1309     History Chief Complaint  Patient presents with  . Anxiety    Jordan Carroll is a 25 y.o. male.  HPI 25 year old male with history of anxiety, allergies, concussion, generally [name] disorder presents to the ER with complaints of intermittent chest pain and palpitations.  States that this has been ongoing for over a month.  He will wake up in the middle of the night at times with feeling like his heart is racing.  He was seen by his PCP several weeks ago and was started on Lexapro and was given hydroxyzine.  He states that he has taken the hydroxyzine several times, with mixed results.  He states that he notices that he will get his symptoms when he is in large crowds, and sometimes at night which wakes him up and then he cannot sleep.  He did have COVID several months ago, has had no leg swelling, no recent travel, no significant shortness of breath.  Mom at bedside states that he does have cardiology follow-up pending in several weeks.  He denies any fevers, chills, cough, pleuritic symptoms.    Past Medical History:  Diagnosis Date  . Allergy   . Anxiety   . Concussion     Patient Active Problem List   Diagnosis Date Noted  . Generalized anxiety disorder 01/22/2018  . Visit for preventive health examination 05/30/2017  . Peanut allergy 05/30/2017    Past Surgical History:  Procedure Laterality Date  . NO PAST SURGERIES         Family History  Problem Relation Age of Onset  . Healthy Mother   . Hyperlipidemia Father   . Heart attack Maternal Grandmother   . Cancer Maternal Grandfather        Leukemia    Social History   Tobacco Use  . Smoking status: Former Smoker    Types: E-cigarettes    Quit date: 01/22/2018    Years since quitting: 3.1  . Smokeless tobacco: Never Used  Vaping Use  . Vaping Use: Never used  Substance Use Topics  . Alcohol use:  Yes    Comment: occ  . Drug use: Yes    Types: Marijuana    Home Medications Prior to Admission medications   Medication Sig Start Date End Date Taking? Authorizing Provider  escitalopram (LEXAPRO) 10 MG tablet Take 1 tablet (10 mg total) by mouth at bedtime. Increase to 2 tablets (20 mg total) by mouth at bedtime after 1 week. 02/23/21  Yes Janeece Agee, NP  hydrOXYzine (ATARAX/VISTARIL) 10 MG tablet Take 0.5-1 tablets (5-10 mg total) by mouth 3 (three) times daily as needed for anxiety. 02/23/21  Yes Janeece Agee, NP  loratadine (CLARITIN) 10 MG tablet Take 10 mg by mouth daily.   Yes [provider]  albuterol (VENTOLIN HFA) 108 (90 Base) MCG/ACT inhaler Inhale 2 puffs into the lungs every 6 (six) hours as needed for wheezing or shortness of breath. 02/23/21   Janeece Agee, NP  EPINEPHrine 0.3 mg/0.3 mL IJ SOAJ injection Inject into the muscle. 04/24/15   [provider]    Allergies    Peanut oil and Other  Review of Systems   Review of Systems  Constitutional: Negative for chills and fever.  HENT: Negative for ear pain and sore throat.   Eyes: Negative for pain and visual disturbance.  Respiratory: Negative for cough and shortness of  breath.   Cardiovascular: Positive for chest pain and palpitations.  Gastrointestinal: Negative for abdominal pain and vomiting.  Genitourinary: Negative for dysuria and hematuria.  Musculoskeletal: Negative for arthralgias and back pain.  Skin: Negative for color change and rash.  Neurological: Negative for seizures and syncope.  All other systems reviewed and are negative.   Physical Exam Updated Vital Signs BP 120/73 (BP Location: Right Arm)   Pulse 64   Temp 98.1 F (36.7 C) (Oral)   Resp 16   Ht 5\' 10"  (1.778 m)   Wt 71.5 kg   SpO2 100%   BMI 22.61 kg/m   Physical Exam Vitals and nursing note reviewed.  Constitutional:      Appearance: He is well-developed.  HENT:     Head: Normocephalic and atraumatic.   Eyes:     Conjunctiva/sclera: Conjunctivae normal.  Cardiovascular:     Rate and Rhythm: Normal rate and regular rhythm.     Heart sounds: No murmur heard.   Pulmonary:     Effort: Pulmonary effort is normal. No respiratory distress.     Breath sounds: Normal breath sounds.  Abdominal:     General: Abdomen is flat.     Palpations: Abdomen is soft.     Tenderness: There is no abdominal tenderness.  Musculoskeletal:        General: Normal range of motion.     Cervical back: Normal range of motion and neck supple.  Skin:    General: Skin is warm and dry.  Neurological:     General: No focal deficit present.     Mental Status: He is alert and oriented to person, place, and time.  Psychiatric:        Mood and Affect: Mood normal.        Behavior: Behavior normal.     ED Results / Procedures / Treatments   Labs (all labs ordered are listed, but only abnormal results are displayed) Labs Reviewed  CBC  BASIC METABOLIC PANEL  TSH    EKG EKG Interpretation  Date/Time:  Thursday March 04 2021 13:23:44 EDT Ventricular Rate:  77 PR Interval:  134 QRS Duration: 92 QT Interval:  374 QTC Calculation: 423 R Axis:   88 Text Interpretation: Normal sinus rhythm with sinus arrhythmia Normal ECG Confirmed by [birth year] ([id number]) on 03/04/2021 2:27:16 PM   Radiology DG Chest Portable 1 View  Result Date: 03/04/2021 CLINICAL DATA:  Chest pain. EXAM: PORTABLE CHEST 1 VIEW COMPARISON:  January 25, 2018. FINDINGS: The heart size and mediastinal contours are within normal limits. Both lungs are clear. No pneumothorax or pleural effusion is noted. The visualized skeletal structures are unremarkable. IMPRESSION: No active disease. Electronically Signed   By: January 27, 2018 M.D.   On: 03/04/2021 14:29    Procedures Procedures   Medications Ordered in ED Medications - No data to display  ED Course  I have reviewed the triage vital signs and the nursing notes.  Pertinent labs &  imaging results that were available during my care of the patient were reviewed by me and considered in my medical decision making (see chart for details).    MDM Rules/Calculators/A&P                         25 year old male with complaints of intermittent chest pain and palpitations over the last month.  On arrival, he is very well-appearing, no acute distress, resting comfortably in the ER bed.  Heart rate  on arrival of 72, EKG normal sinus rhythm.  No murmurs noted.  Speaking in full sentences without increased work of breathing.  I explained to the mother at bedside that his EKG here today is normal, and he is currently asymptomatic in the ER.  She however is concerned has his family does have a cardiac history.  I did order basic labs which were overall unremarkable, TSH ordered for PCP follow-up.  Chest x-ray without any acute abnormalities.  Stressed follow-up with the cardiologist, and encouraged the patient to continue to take his antianxiety medicines.  Low suspicion for ACS, A. fib, pericarditis, myocarditis, PE or any other acute life-threatening causes of his symptoms.  No evidence of thyrotoxicosis.  We discussed return precautions.  He was understanding and is agreeable.  Stable for discharge.  Final Clinical Impression(s) / ED Diagnoses Final diagnoses:  Palpitations    Rx / DC Orders ED Discharge Orders    None       Leone Brand 03/04/21 1445    Virgina Norfolk, DO 03/04/21 1446

## 2021-03-04 NOTE — ED Triage Notes (Signed)
Pt has been having some anxiety for several months.  Pt has intermittent "palpations".  Pt admits to feeling lightheaded, fingers tingling and chest pain.

## 2021-03-04 NOTE — Discharge Instructions (Addendum)
Work-up today was overall reassuring.  Your thyroid levels would not come back today, please follow-up on these via the MyChart app and follow-up with your primary care doctor to review these this is abnormal.  Please make sure to keep your cardiology appointment.  Continue to take your anxiety medicines.  Return to the ER for any new or worsening symptoms.

## 2021-03-04 NOTE — ED Notes (Signed)
AVS reviewed with pt. Discussed making and following up with Cardiology. Opportunity for questions provided. Copy of AVS given to pt

## 2021-03-04 NOTE — ED Notes (Signed)
ED Provider at bedside. 

## 2021-03-04 NOTE — ED Notes (Signed)
Presents with palpitations for the past month, with the last week having more frequent episodes, accompanied with feeling light headed as well. Placed on cont cardiac monitoring, NSR noted w/o ectopy at this time.

## 2021-03-23 ENCOUNTER — Ambulatory Visit (INDEPENDENT_AMBULATORY_CARE_PROVIDER_SITE_OTHER): Payer: BC Managed Care – PPO

## 2021-03-23 ENCOUNTER — Encounter: Payer: Self-pay | Admitting: Cardiology

## 2021-03-23 ENCOUNTER — Ambulatory Visit (INDEPENDENT_AMBULATORY_CARE_PROVIDER_SITE_OTHER): Payer: BC Managed Care – PPO | Admitting: Cardiology

## 2021-03-23 ENCOUNTER — Other Ambulatory Visit: Payer: Self-pay

## 2021-03-23 VITALS — BP 106/72 | HR 93 | Ht 70.0 in | Wt 154.0 lb

## 2021-03-23 DIAGNOSIS — F411 Generalized anxiety disorder: Secondary | ICD-10-CM

## 2021-03-23 DIAGNOSIS — R002 Palpitations: Secondary | ICD-10-CM | POA: Diagnosis not present

## 2021-03-23 DIAGNOSIS — R0600 Dyspnea, unspecified: Secondary | ICD-10-CM | POA: Diagnosis not present

## 2021-03-23 DIAGNOSIS — R0609 Other forms of dyspnea: Secondary | ICD-10-CM

## 2021-03-23 NOTE — Progress Notes (Signed)
Cardiology Consultation:    Date:  03/23/2021   ID:  Jordan Carroll, DOB [birth year], MRN [id number]  PCP:  Jordan Agee, NP  Cardiologist:  Jordan Balsam, MD   Referring MD: Jordan Agee, NP   Chief Complaint  Patient presents with  . Palpitations    Few months, easter was the worst episode . Light headed and numbness     History of Present Illness:    Jordan Carroll is a 25 y.o. male who is being seen today for the evaluation of palpitations at the request of Jordan Agee, NP.  History complaining of palpitation for last few months what he means by that is the fact that his heart skips a beat.  That typically happen when he is trying to relax that making him very anxious.  He does have some anxiety and that palpitation anxiety worse.  Denies have any shortness of breath except while exercising.  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  However when he was in the emergency room last time he was complaining of having some chest pain which was very atypical not exercise related.  He does know what his cholesterol is, he does not smoke anymore.  He does not have family history of premature coronary artery disease but interestingly his father was recently diagnosed with atrial flutter and atrial flutter ablation is contemplated. He does not know what his cholesterol is He is very active and does do some weightlifting He drinks occasionally alcohol  Past Medical History:  Diagnosis Date  . Allergy   . Anxiety   . Concussion   . Generalized anxiety disorder 01/22/2018  . Peanut allergy 05/30/2017  . Visit for preventive health examination 05/30/2017    Past Surgical History:  Procedure Laterality Date  . NO PAST SURGERIES      Current Medications: Current Meds  Medication Sig  . albuterol (VENTOLIN HFA) 108 (90 Base) MCG/ACT inhaler Inhale 2 puffs into the lungs every 6 (six) hours as needed for  wheezing or shortness of breath.  . EPINEPHrine 0.3 mg/0.3 mL IJ SOAJ injection Inject 0.3 mg into the muscle as needed for anaphylaxis.  Marland Kitchen escitalopram (LEXAPRO) 10 MG tablet Take 1 tablet (10 mg total) by mouth at bedtime. Increase to 2 tablets (20 mg total) by mouth at bedtime after 1 week. (Patient taking differently: Take 20 mg by mouth at bedtime. Increase to 2 tablets (20 mg total) by mouth at bedtime after 1 week.)  . hydrOXYzine (ATARAX/VISTARIL) 10 MG tablet Take 0.5-1 tablets (5-10 mg total) by mouth 3 (three) times daily as needed for anxiety.  Marland Kitchen loratadine (CLARITIN) 10 MG tablet Take 10 mg by mouth daily.     Allergies:   Peanut oil and Other   Social History   Socioeconomic History  . Marital status: Single    Spouse name: Not on file  . Number of children: Not on file  . Years of education: Not on file  . Highest education level: Not on file  Occupational History  . Occupation: Student    Comment: [employer]  Tobacco Use  . Smoking status: Former Smoker    Types: E-cigarettes    Quit date: 01/22/2018    Years since quitting: 3.1  . Smokeless tobacco: Never Used  Vaping Use  . Vaping Use: Never used  Substance and Sexual Activity  . Alcohol use: Yes  Comment: occ  . Drug use: Yes    Types: Marijuana  . Sexual activity: Not on file  Other Topics Concern  . Not on file  Social History Narrative  . Not on file   Social Determinants of Health   Financial Resource Strain: Not on file  Food Insecurity: Not on file  Transportation Needs: Not on file  Physical Activity: Not on file  Stress: Not on file  Social Connections: Not on file     Family History: The patient's family history includes Cancer in his maternal grandfather; Healthy in his mother; Heart attack in his maternal grandmother; Hyperlipidemia in his father. ROS:   Please see the history of present illness.    All 14 point review of systems negative except as described per history of present  illness.  EKGs/Labs/Other Studies Reviewed:    The following studies were reviewed today:   EKG:  EKG is  ordered today.  The ekg ordered today demonstrates normal sinus rhythm normal P interval normal QS complex duration morphology no ST segment changes  Recent Labs: 03/04/2021: BUN 16; Creatinine, Ser 1.00; Hemoglobin 15.2; Platelets 286; Potassium 4.0; Sodium 137; TSH 1.021  Recent Lipid Panel No results found for: CHOL, TRIG, HDL, CHOLHDL, VLDL, LDLCALC, LDLDIRECT  Physical Exam:    VS:  BP 106/72 (BP Location: Right Arm, Patient Position: Sitting)   Pulse 93   Ht 5\' 10"  (1.778 m)   Wt 154 lb (69.9 kg)   SpO2 99%   BMI 22.10 kg/m     Wt Readings from Last 3 Encounters:  03/23/21 154 lb (69.9 kg)  03/04/21 157 lb 9.6 oz (71.5 kg)  02/23/21 157 lb 9.6 oz (71.5 kg)     GEN:  Well nourished, well developed in no acute distress HEENT: Normal NECK: No JVD; No carotid bruits LYMPHATICS: No lymphadenopathy CARDIAC: RRR, no murmurs, no rubs, no gallops RESPIRATORY:  Clear to auscultation without rales, wheezing or rhonchi  ABDOMEN: Soft, non-tender, non-distended MUSCULOSKELETAL:  No edema; No deformity  SKIN: Warm and dry NEUROLOGIC:  Alert and oriented x 3 PSYCHIATRIC:  Normal affect   ASSESSMENT:    1. Generalized anxiety disorder   2. Palpitations   3. Dyspnea on exertion    PLAN:    In order of problems listed above:  1. Palpitations.  I will ask him to wear Zio patch for 1 week to see exactly what kind of arrhythmia if any we dealing with.  We did talk also about potential treatment for it with beta-blocker which can be helpful also for anxiety disorder.  I will schedule him also to have an echocardiogram to assess left ventricle ejection fraction.  Overall have low level suspicion particularly with some sinus arrhythmia.  He is already avoiding caffeinated drinks and coffee. 2. Dyspnea exertion very mild echocardiogram will be done to assess left ventricle  ejection fraction. 3. Generalized anxiety disorder.  To be followed by antimedicine team I did talk about potentially looking for psychotherapist that can help to manage this issue.  Also in the future we may use a small dose of beta-blocker to help with the palpitation which also should help with anxiety issues.   Medication Adjustments/Labs and Tests Ordered: Current medicines are reviewed at length with the patient today.  Concerns regarding medicines are outlined above.  No orders of the defined types were placed in this encounter.  No orders of the defined types were placed in this encounter.   Signed, 04/25/21, MD,  FACC. 03/23/2021 2:07 PM    [facility]

## 2021-03-23 NOTE — Patient Instructions (Signed)
Medication Instructions:  Your physician recommends that you continue on your current medications as directed. Please refer to the Current Medication list given to you today.  *If you need a refill on your cardiac medications before your next appointment, please call your pharmacy*   Lab Work: None If you have labs (blood work) drawn today and your tests are completely normal, you will receive your results only by: MyChart Message (if you have MyChart) OR A paper copy in the mail If you have any lab test that is abnormal or we need to change your treatment, we will call you to review the results.   Testing/Procedures: Your physician has requested that you have an echocardiogram. Echocardiography is a painless test that uses sound waves to create images of your heart. It provides your doctor with information about the size and shape of your heart and how well your heart's chambers and valves are working. This procedure takes approximately one hour. There are no restrictions for this procedure.  A zio monitor was ordered today. It will remain on for 7 days. You will then return monitor and event diary in provided box. It takes 1-2 weeks for report to be downloaded and returned to us. We will call you with the results. If monitor falls off or has orange flashing light, please call Zio for further instructions.     Follow-Up: At [facility], you and your health needs are our priority.  As part of our continuing mission to provide you with exceptional heart care, we have created designated Provider Care Teams.  These Care Teams include your primary Cardiologist (physician) and Advanced Practice Providers (APPs -  Physician Assistants and Nurse Practitioners) who all work together to provide you with the care you need, when you need it.  We recommend signing up for the patient portal called "MyChart".  Sign up information is provided on this After Visit Summary.  MyChart is used to connect with  patients for Virtual Visits (Telemedicine).  Patients are able to view lab/test results, encounter notes, upcoming appointments, etc.  Non-urgent messages can be sent to your provider as well.   To learn more about what you can do with MyChart, go to [email or web address].    Your next appointment:   2 month(s)  The format for your next appointment:   In Person  Provider:   Robert Krasowski, MD   Other Instructions   

## 2021-03-31 DIAGNOSIS — R0600 Dyspnea, unspecified: Secondary | ICD-10-CM | POA: Diagnosis not present

## 2021-03-31 DIAGNOSIS — R002 Palpitations: Secondary | ICD-10-CM | POA: Diagnosis not present

## 2021-04-16 ENCOUNTER — Ambulatory Visit (INDEPENDENT_AMBULATORY_CARE_PROVIDER_SITE_OTHER): Payer: BC Managed Care – PPO | Admitting: Registered Nurse

## 2021-04-16 ENCOUNTER — Encounter: Payer: Self-pay | Admitting: Registered Nurse

## 2021-04-16 ENCOUNTER — Telehealth: Payer: Self-pay | Admitting: Emergency Medicine

## 2021-04-16 ENCOUNTER — Other Ambulatory Visit: Payer: Self-pay

## 2021-04-16 VITALS — BP 111/61 | HR 72 | Temp 98.4°F | Resp 18 | Ht 70.0 in | Wt 156.6 lb

## 2021-04-16 DIAGNOSIS — F411 Generalized anxiety disorder: Secondary | ICD-10-CM | POA: Diagnosis not present

## 2021-04-16 DIAGNOSIS — Z7689 Persons encountering health services in other specified circumstances: Secondary | ICD-10-CM | POA: Diagnosis not present

## 2021-04-16 DIAGNOSIS — K219 Gastro-esophageal reflux disease without esophagitis: Secondary | ICD-10-CM

## 2021-04-16 MED ORDER — PANTOPRAZOLE SODIUM 40 MG PO TBEC: 40.0000 mg | DELAYED_RELEASE_TABLET | Freq: Every day | ORAL | 3 refills | Status: DC

## 2021-04-16 MED ORDER — ESCITALOPRAM OXALATE 20 MG PO TABS: 20.0000 mg | ORAL_TABLET | Freq: Every day | ORAL | 3 refills | Status: DC

## 2021-04-16 MED ORDER — METOPROLOL SUCCINATE ER 25 MG PO TB24: 25.0000 mg | ORAL_TABLET | Freq: Every day | ORAL | 1 refills | Status: DC

## 2021-04-16 MED ORDER — HYDROXYZINE HCL 10 MG PO TABS: 5.0000 mg | ORAL_TABLET | Freq: Three times a day (TID) | ORAL | 5 refills | Status: DC | PRN

## 2021-04-16 NOTE — Telephone Encounter (Signed)
Called and spoke to patient mother per dpr informed her of results and recommendations for patient to start metoprolol succinate 25 mg daily she will inform patient. No further questions.

## 2021-04-16 NOTE — Progress Notes (Signed)
Established Patient Office Visit  Subjective:  Patient ID: Jordan Carroll, male    DOB: 1996-01-26  Age: 25 y.o. MRN: [id number]  CC:  Chief Complaint  Patient presents with  . Transitions Of Care    Patient states he is here for [facility] and discuss anxiety. GAD7=10     HPI Jordan Carroll presents for visit to est care and anxiety follow up  Histories reviewed and updated with patient.   States lexapro working well, hydroxyzine perfect for breakthrough anxiety, not too sedating  Of note has seen cardiology, was placed on metoprolol 25mg  XL po qd for arrythmia. Has upcoming echo on Monday.   Otherwise, notes that he has had epigastric pain. Feels full and dull. Worse at night and in the morning. Metallic taste in mouth. More belching. Otherwise no changes to diet, exercise, denies weight changes, melena, blood in stool, hemoptysis. Has not tried treatment.   Past Medical History:  Diagnosis Date  . Allergy   . Anxiety   . Concussion   . Generalized anxiety disorder 01/22/2018  . Peanut allergy 05/30/2017  . Visit for preventive health examination 05/30/2017    Past Surgical History:  Procedure Laterality Date  . NO PAST SURGERIES      Family History  Problem Relation Age of Onset  . Healthy Mother   . Hyperlipidemia Father   . Heart attack Maternal Grandmother   . Cancer Maternal Grandfather        Leukemia    Social History   Socioeconomic History  . Marital status: Single    Spouse name: Not on file  . Number of children: Not on file  . Years of education: Not on file  . Highest education level: Not on file  Occupational History  . Occupation: Student    Comment: [employer]  Tobacco Use  . Smoking status: Former Smoker    Types: E-cigarettes    Quit date: 01/22/2018    Years since quitting: 3.2  . Smokeless tobacco: Never Used  Vaping Use  . Vaping Use: Never used  Substance and Sexual Activity  . Alcohol use: Yes    Comment: occ  . Drug use: Yes    Types:  Marijuana  . Sexual activity: Not on file  Other Topics Concern  . Not on file  Social History Narrative  . Not on file   Social Determinants of Health   Financial Resource Strain: Not on file  Food Insecurity: Not on file  Transportation Needs: Not on file  Physical Activity: Not on file  Stress: Not on file  Social Connections: Not on file  Intimate Partner Violence: Not on file    Outpatient Medications Prior to Visit  Medication Sig Dispense Refill  . albuterol (VENTOLIN HFA) 108 (90 Base) MCG/ACT inhaler Inhale 2 puffs into the lungs every 6 (six) hours as needed for wheezing or shortness of breath. 18 g 5  . EPINEPHrine 0.3 mg/0.3 mL IJ SOAJ injection Inject 0.3 mg into the muscle as needed for anaphylaxis.    03/24/2018 escitalopram (LEXAPRO) 10 MG tablet Take 1 tablet (10 mg total) by mouth at bedtime. Increase to 2 tablets (20 mg total) by mouth at bedtime after 1 week. (Patient taking differently: Take 20 mg by mouth at bedtime. Increase to 2 tablets (20 mg total) by mouth at bedtime after 1 week.) 90 tablet 0  . hydrOXYzine (ATARAX/VISTARIL) 10 MG tablet Take 0.5-1 tablets (5-10 mg total) by mouth 3 (three) times daily as needed  for anxiety. 30 tablet 0  . loratadine (CLARITIN) 10 MG tablet Take 10 mg by mouth daily.    . metoprolol succinate (TOPROL-XL) 25 MG 24 hr tablet Take 1 tablet (25 mg total) by mouth daily. Take with or immediately following a meal. 90 tablet 1   No facility-administered medications prior to visit.    Allergies  Allergen Reactions  . Peanut Oil Anaphylaxis    All tree nuts  . Other Other (See Comments)    Animal saliva, tree nuts-unknown reaction    ROS Review of Systems Per hpi, otherwise negative   Objective:    Physical Exam Constitutional:      General: He is not in acute distress.    Appearance: Normal appearance. He is normal weight. He is not ill-appearing, toxic-appearing or diaphoretic.  Cardiovascular:     Rate and Rhythm: Normal  rate and regular rhythm.     Heart sounds: Normal heart sounds. No murmur heard. No friction rub. No gallop.   Pulmonary:     Effort: Pulmonary effort is normal. No respiratory distress.     Breath sounds: Normal breath sounds. No stridor. No wheezing, rhonchi or rales.  Chest:     Chest wall: No tenderness.  Neurological:     General: No focal deficit present.     Mental Status: He is alert and oriented to person, place, and time. Mental status is at baseline.  Psychiatric:        Mood and Affect: Mood normal.        Behavior: Behavior normal.        Thought Content: Thought content normal.        Judgment: Judgment normal.     BP 111/61   Pulse 72   Temp 98.4 F (36.9 C) (Temporal)   Resp 18   Ht 5\' 10"  (1.778 m)   Wt 156 lb 9.6 oz (71 kg)   SpO2 99%   BMI 22.47 kg/m  Wt Readings from Last 3 Encounters:  04/16/21 156 lb 9.6 oz (71 kg)  03/23/21 154 lb (69.9 kg)  03/04/21 157 lb 9.6 oz (71.5 kg)     There are no preventive care reminders to display for this patient.  There are no preventive care reminders to display for this patient.  Lab Results  Component Value Date   TSH 1.021 03/04/2021   Lab Results  Component Value Date   WBC 6.3 03/04/2021   HGB 15.2 03/04/2021   HCT 44.4 03/04/2021   MCV 85.1 03/04/2021   PLT 286 03/04/2021   Lab Results  Component Value Date   NA 137 03/04/2021   K 4.0 03/04/2021   CO2 27 03/04/2021   GLUCOSE 86 03/04/2021   BUN 16 03/04/2021   CREATININE 1.00 03/04/2021   BILITOT 0.6 01/19/2018   ALKPHOS 68 01/19/2018   AST 14 01/19/2018   ALT 8 01/19/2018   PROT 7.1 01/19/2018   ALBUMIN 5.0 01/19/2018   CALCIUM 9.3 03/04/2021   ANIONGAP 8 03/04/2021   GFR 110.36 01/26/2018   No results found for: CHOL No results found for: HDL No results found for: LDLCALC No results found for: TRIG No results found for: CHOLHDL No results found for: 01/28/2018    Assessment & Plan:   Problem List Items Addressed This Visit    None     No orders of the defined types were placed in this encounter.   Follow-up: No follow-ups on file.   PLAN  Continue lexapro and hydroxyzine.  Glad he will be starting metoprolol, I think this could help with anxiety as well.  Start pantoprazole 40mg  PO qd. Take for at least one week at a time. Ok to pause after that. Reviewed nonpharm and diet choices for GERD. Return if worsening or failing to improve  Patient encouraged to call clinic with any questions, comments, or concerns.  , NP

## 2021-04-16 NOTE — Patient Instructions (Signed)
° ° ° °  If you have lab work done today you will be contacted with your lab results within the next 2 weeks.  If you have not heard from us then please contact us. The fastest way to get your results is to register for My Chart. ° ° °IF you received an x-ray today, you will receive an invoice from [facility]. Please contact [facility] at [phone] with questions or concerns regarding your invoice.  ° °IF you received labwork today, you will receive an invoice from [facility]. Please contact [facility] at [phone] with questions or concerns regarding your invoice.  ° °Our billing staff will not be able to assist you with questions regarding bills from these companies. ° °You will be contacted with the lab results as soon as they are available. The fastest way to get your results is to activate your My Chart account. Instructions are located on the last page of this paperwork. If you have not heard from us regarding the results in 2 weeks, please contact this office. °  ° ° ° °

## 2021-04-16 NOTE — Telephone Encounter (Signed)
-----   Message from Georgeanna Lea, MD sent at 04/14/2021  9:30 PM EDT ----- Symptomatic supraventricular tachycardia.  Please start metoprolol succinate 25 daily

## 2021-04-19 ENCOUNTER — Other Ambulatory Visit: Payer: Self-pay

## 2021-04-19 ENCOUNTER — Ambulatory Visit (HOSPITAL_COMMUNITY): Payer: BC Managed Care – PPO | Attending: Internal Medicine

## 2021-04-19 DIAGNOSIS — R0609 Other forms of dyspnea: Secondary | ICD-10-CM

## 2021-04-19 DIAGNOSIS — R0600 Dyspnea, unspecified: Secondary | ICD-10-CM | POA: Diagnosis not present

## 2021-04-19 DIAGNOSIS — R002 Palpitations: Secondary | ICD-10-CM | POA: Insufficient documentation

## 2021-04-19 LAB — ECHOCARDIOGRAM COMPLETE
Area-P 1/2: 4.24 cm2
S' Lateral: 3.2 cm

## 2021-04-19 MED ORDER — PERFLUTREN LIPID MICROSPHERE
1.0000 mL | INTRAVENOUS | Status: AC | PRN
  Administered 2021-04-19: 1 mL via INTRAVENOUS

## 2021-04-23 ENCOUNTER — Telehealth: Payer: Self-pay

## 2021-04-23 NOTE — Telephone Encounter (Signed)
Spoke with patient father Goble Fudala, notified of results and will inform the patient.

## 2021-04-23 NOTE — Progress Notes (Unsigned)
Spoke with patient father Jeff Goeller, notified of results and will inform the patient. 

## 2021-05-10 ENCOUNTER — Encounter: Payer: BC Managed Care – PPO | Admitting: Internal Medicine

## 2021-05-21 DIAGNOSIS — S060XAA Concussion with loss of consciousness status unknown, initial encounter: Secondary | ICD-10-CM | POA: Insufficient documentation

## 2021-05-21 DIAGNOSIS — S060X9A Concussion with loss of consciousness of unspecified duration, initial encounter: Secondary | ICD-10-CM | POA: Insufficient documentation

## 2021-05-21 DIAGNOSIS — F419 Anxiety disorder, unspecified: Secondary | ICD-10-CM | POA: Insufficient documentation

## 2021-05-21 DIAGNOSIS — T7840XA Allergy, unspecified, initial encounter: Secondary | ICD-10-CM | POA: Insufficient documentation

## 2021-05-22 ENCOUNTER — Other Ambulatory Visit: Payer: Self-pay | Admitting: Registered Nurse

## 2021-05-22 DIAGNOSIS — F411 Generalized anxiety disorder: Secondary | ICD-10-CM

## 2021-05-31 ENCOUNTER — Ambulatory Visit: Payer: BC Managed Care – PPO | Admitting: Cardiology

## 2021-06-16 IMAGING — DX DG CHEST 1V PORT
1 series · 1 of 1 positions shown · non-contrast
Comparison: January 25, 2018.

CLINICAL DATA: Chest pain.

EXAM:
PORTABLE CHEST 1 VIEW

[chest ap]
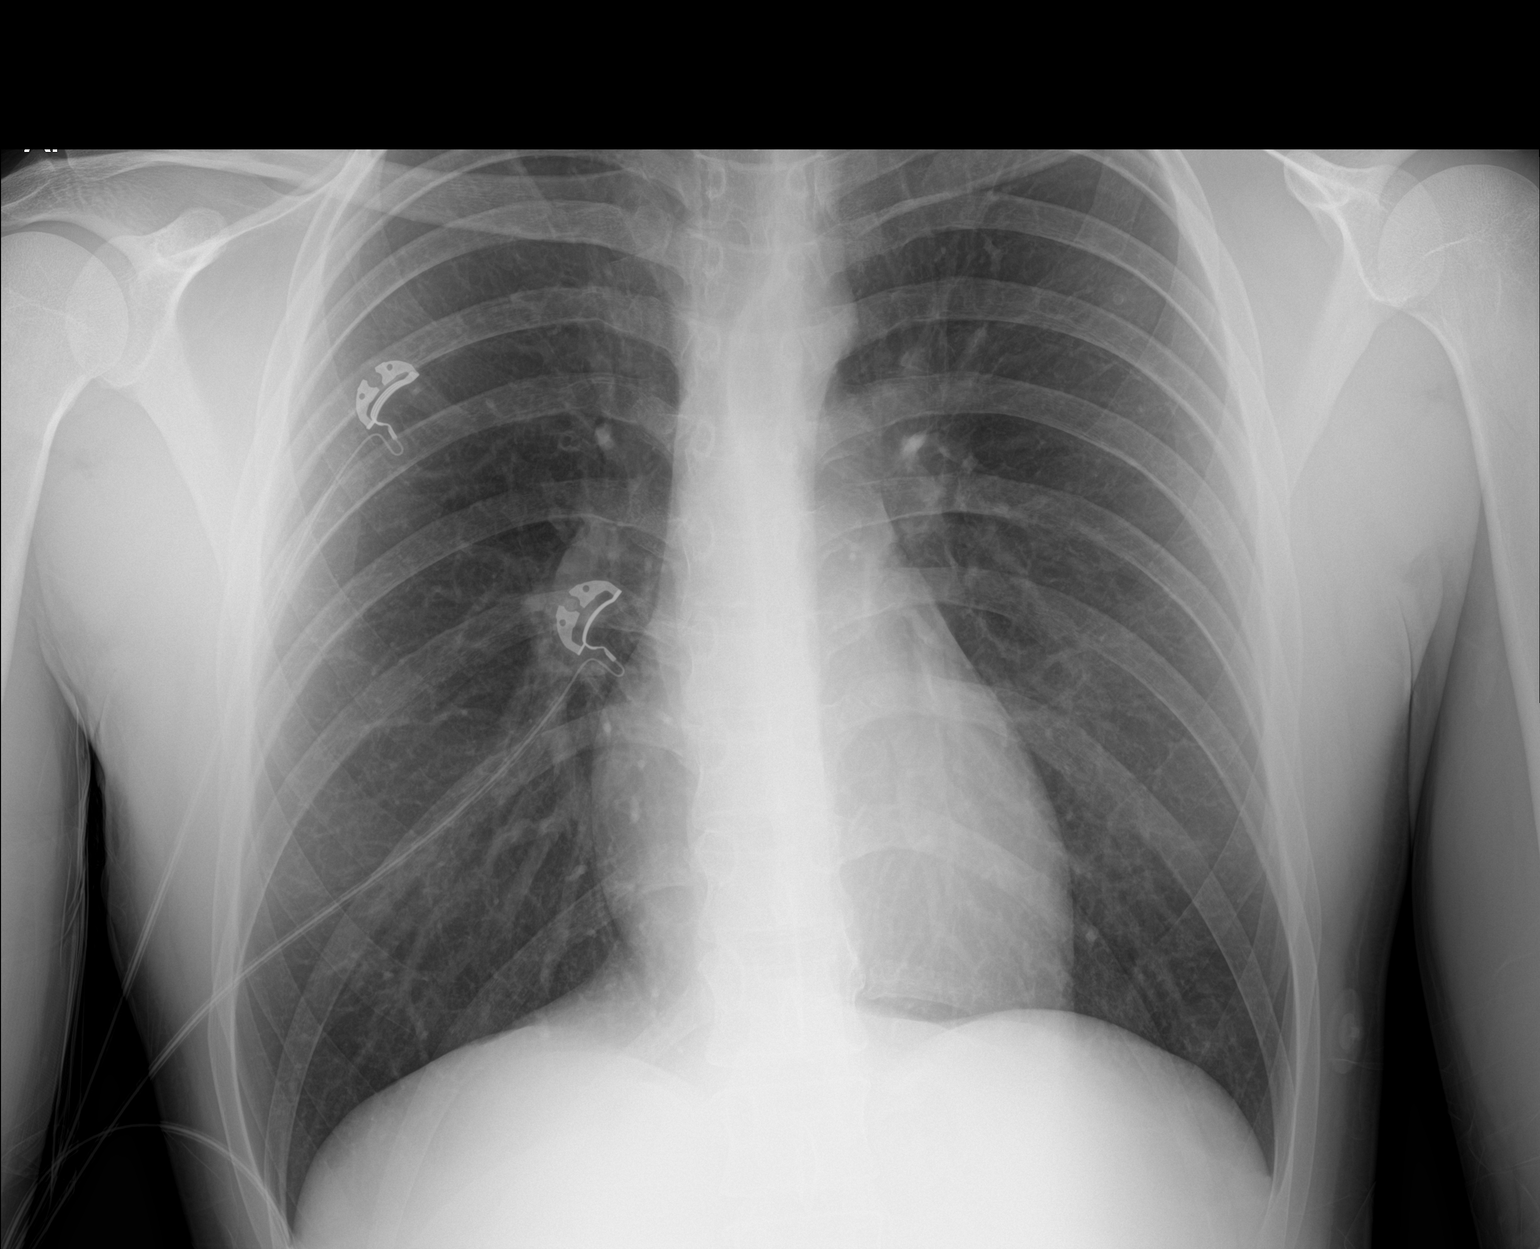

[1 of 1 positions shown; findings below may reference images not displayed]

FINDINGS: The heart size and mediastinal contours are within normal limits.
Both lungs are clear. No pneumothorax or pleural effusion is noted.
The visualized skeletal structures are unremarkable.
IMPRESSION: No active disease.

## 2021-08-10 ENCOUNTER — Telehealth (INDEPENDENT_AMBULATORY_CARE_PROVIDER_SITE_OTHER): Payer: BC Managed Care – PPO | Admitting: Registered Nurse

## 2021-08-10 ENCOUNTER — Other Ambulatory Visit: Payer: Self-pay

## 2021-08-10 DIAGNOSIS — B9689 Other specified bacterial agents as the cause of diseases classified elsewhere: Secondary | ICD-10-CM | POA: Diagnosis not present

## 2021-08-10 DIAGNOSIS — J019 Acute sinusitis, unspecified: Secondary | ICD-10-CM

## 2021-08-10 MED ORDER — PREDNISONE 10 MG (21) PO TBPK: ORAL_TABLET | ORAL | 0 refills | Status: DC

## 2021-08-10 MED ORDER — AMOXICILLIN-POT CLAVULANATE 875-125 MG PO TABS: 1.0000 | ORAL_TABLET | Freq: Two times a day (BID) | ORAL | 0 refills | Status: DC

## 2021-08-10 NOTE — Patient Instructions (Signed)
° ° ° °  If you have lab work done today you will be contacted with your lab results within the next 2 weeks.  If you have not heard from us then please contact us. The fastest way to get your results is to register for My Chart. ° ° °IF you received an x-ray today, you will receive an invoice from [facility]. Please contact [facility] at [phone] with questions or concerns regarding your invoice.  ° °IF you received labwork today, you will receive an invoice from [facility]. Please contact [facility] at [phone] with questions or concerns regarding your invoice.  ° °Our billing staff will not be able to assist you with questions regarding bills from these companies. ° °You will be contacted with the lab results as soon as they are available. The fastest way to get your results is to activate your My Chart account. Instructions are located on the last page of this paperwork. If you have not heard from us regarding the results in 2 weeks, please contact this office. °  ° ° ° °

## 2021-08-10 NOTE — Progress Notes (Signed)
Telemedicine Encounter- SOAP NOTE Established Patient  This telephone encounter was conducted with the patient's (or proxy's) verbal consent via audio telecommunications: yes/no: Yes Patient was instructed to have this encounter in a suitably private space; and to only have persons present to whom they give permission to participate. In addition, patient identity was confirmed by use of name plus two identifiers (DOB and address).  I discussed the limitations, risks, security and privacy concerns of performing an evaluation and management service by telephone and the availability of in person appointments. I also discussed with the patient that there may be a patient responsible charge related to this service. The patient expressed understanding and agreed to proceed.  I spent a total of 17 minutes talking with the patient or their proxy.  Patient at home Provider in office  Participants: Jordan Sportsman, NP and Jordan Carroll  Chief Complaint  Patient presents with   Sore Throat    Patient states he has been experiencing a sore throat, SOB, congestion and has had two negative test.he has been taking advil and ibuprofen with some relief    Subjective   Jordan Carroll is a 25 y.o. established patient. Telephone visit today for sore throat  HPI Onset - around 7-8 days ago. Better Friday, then worse on Sunday. Pressure in face, ethmoid sinuses, frontal sinuses Heavy head,  Some fatigue Sore throat on waking  Taken 2x covid tests, both negative. Not vaccinated against COVID   Patient Active Problem List   Diagnosis Date Noted   Concussion 05/21/2021   Anxiety 05/21/2021   Allergy 05/21/2021   Palpitations 03/23/2021   Dyspnea on exertion 03/23/2021   Generalized anxiety disorder 01/22/2018   Visit for preventive health examination 05/30/2017   Peanut allergy 05/30/2017    Past Medical History:  Diagnosis Date   Allergy    Anxiety    Concussion    Generalized anxiety  disorder 01/22/2018   Peanut allergy 05/30/2017   Visit for preventive health examination 05/30/2017    Current Outpatient Medications  Medication Sig Dispense Refill   albuterol (VENTOLIN HFA) 108 (90 Base) MCG/ACT inhaler Inhale 2 puffs into the lungs every 6 (six) hours as needed for wheezing or shortness of breath. 18 g 5   amoxicillin-clavulanate (AUGMENTIN) 875-125 MG tablet Take 1 tablet by mouth 2 (two) times daily. 20 tablet 0   EPINEPHrine 0.3 mg/0.3 mL IJ SOAJ injection Inject 0.3 mg into the muscle as needed for anaphylaxis.     escitalopram (LEXAPRO) 20 MG tablet Take 1 tablet (20 mg total) by mouth daily. 90 tablet 3   hydrOXYzine (ATARAX/VISTARIL) 10 MG tablet Take 0.5-1 tablets (5-10 mg total) by mouth 3 (three) times daily as needed for anxiety. 30 tablet 5   loratadine (CLARITIN) 10 MG tablet Take 10 mg by mouth daily.     pantoprazole (PROTONIX) 40 MG tablet Take 1 tablet (40 mg total) by mouth daily. 30 tablet 3   predniSONE (STERAPRED UNI-PAK 21 TAB) 10 MG (21) TBPK tablet Take per package instructions. Do not skip doses. Finish entire supply. 1 each 0   metoprolol succinate (TOPROL-XL) 25 MG 24 hr tablet Take 1 tablet (25 mg total) by mouth daily. Take with or immediately following a meal. 90 tablet 1   No current facility-administered medications for this visit.    Allergies  Allergen Reactions   Peanut Oil Anaphylaxis    All tree nuts   Other Other (See Comments)    Animal saliva, tree  nuts-unknown reaction    Social History   Socioeconomic History   Marital status: Single    Spouse name: Not on file   Number of children: Not on file   Years of education: Not on file   Highest education level: Not on file  Occupational History   Occupation: Student    Comment: [employer]  Tobacco Use   Smoking status: Former    Types: E-cigarettes    Quit date: 01/22/2018    Years since quitting: 3.5   Smokeless tobacco: Never  Vaping Use   Vaping Use: Never used   Substance and Sexual Activity   Alcohol use: Yes    Comment: occ   Drug use: Yes    Types: Marijuana   Sexual activity: Not on file  Other Topics Concern   Not on file  Social History Narrative   Not on file   Social Determinants of Health   Financial Resource Strain: Not on file  Food Insecurity: Not on file  Transportation Needs: Not on file  Physical Activity: Not on file  Stress: Not on file  Social Connections: Not on file  Intimate Partner Violence: Not on file    ROS Per hpi   Objective   Vitals as reported by the patient: There were no vitals filed for this visit.  Jordan Carroll was seen today for sore throat.  Diagnoses and all orders for this visit:  Acute bacterial sinusitis -     amoxicillin-clavulanate (AUGMENTIN) 875-125 MG tablet; Take 1 tablet by mouth 2 (two) times daily. -     predniSONE (STERAPRED UNI-PAK 21 TAB) 10 MG (21) TBPK tablet; Take per package instructions. Do not skip doses. Finish entire supply.   PLAN Given symptoms and duration of infection, likely bacterial. Treat as above Discussed supportive care and reasons to return to clinic.  Patient encouraged to call clinic with any questions, comments, or concerns.  I discussed the assessment and treatment plan with the patient. The patient was provided an opportunity to ask questions and all were answered. The patient agreed with the plan and demonstrated an understanding of the instructions.   The patient was advised to call back or seek an in-person evaluation if the symptoms worsen or if the condition fails to improve as anticipated.  I provided 17 minutes of non-face-to-face time during this encounter.  Jordan Agee, NP

## 2021-08-16 ENCOUNTER — Ambulatory Visit (INDEPENDENT_AMBULATORY_CARE_PROVIDER_SITE_OTHER): Payer: BC Managed Care – PPO | Admitting: Cardiology

## 2021-08-16 ENCOUNTER — Other Ambulatory Visit: Payer: Self-pay

## 2021-08-16 ENCOUNTER — Encounter: Payer: Self-pay | Admitting: Cardiology

## 2021-08-16 VITALS — BP 126/76 | HR 76 | Ht 70.0 in | Wt 175.0 lb

## 2021-08-16 DIAGNOSIS — I471 Supraventricular tachycardia, unspecified: Secondary | ICD-10-CM | POA: Insufficient documentation

## 2021-08-16 DIAGNOSIS — F419 Anxiety disorder, unspecified: Secondary | ICD-10-CM | POA: Diagnosis not present

## 2021-08-16 DIAGNOSIS — R0609 Other forms of dyspnea: Secondary | ICD-10-CM | POA: Diagnosis not present

## 2021-08-16 NOTE — Addendum Note (Signed)
Addended by: Hazle Quant on: 08/16/2021 08:54 AM   Modules accepted: Orders
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
## 2021-08-16 NOTE — Patient Instructions (Signed)
Medication Instructions:  Your physician recommends that you continue on your current medications as directed. Please refer to the Current Medication list given to you today.  *If you need a refill on your cardiac medications before your next appointment, please call your pharmacy*   Lab Work: Your physician recommends that you return for lab work today: lipid  If you have labs (blood work) drawn today and your tests are completely normal, you will receive your results only by: MyChart Message (if you have MyChart) OR A paper copy in the mail If you have any lab test that is abnormal or we need to change your treatment, we will call you to review the results.   Testing/Procedures: None   Follow-Up: At [facility], you and your health needs are our priority.  As part of our continuing mission to provide you with exceptional heart care, we have created designated Provider Care Teams.  These Care Teams include your primary Cardiologist (physician) and Advanced Practice Providers (APPs -  Physician Assistants and Nurse Practitioners) who all work together to provide you with the care you need, when you need it.  We recommend signing up for the patient portal called "MyChart".  Sign up information is provided on this After Visit Summary.  MyChart is used to connect with patients for Virtual Visits (Telemedicine).  Patients are able to view lab/test results, encounter notes, upcoming appointments, etc.  Non-urgent messages can be sent to your provider as well.   To learn more about what you can do with MyChart, go to [email or web address].    Your next appointment:   1 year(s)  The format for your next appointment:   In Person  Provider:   Robert Krasowski, MD   Other Instructions    

## 2021-08-17 LAB — LIPID PANEL
Chol/HDL Ratio: 3 ratio (ref 0.0–5.0)
Cholesterol, Total: 198 mg/dL (ref 100–199)
HDL: 66 mg/dL (ref >39)
LDL Chol Calc (NIH): 118 mg/dL — ABNORMAL HIGH (ref 0–99)
Triglycerides: 78 mg/dL (ref 0–149)
VLDL Cholesterol Cal: 14 mg/dL (ref 5–40)

## 2021-11-05 ENCOUNTER — Other Ambulatory Visit: Payer: Self-pay | Admitting: Cardiology

## 2022-02-03 ENCOUNTER — Telehealth: Payer: Self-pay

## 2022-02-03 ENCOUNTER — Other Ambulatory Visit: Payer: Self-pay

## 2022-02-03 DIAGNOSIS — J4520 Mild intermittent asthma, uncomplicated: Secondary | ICD-10-CM

## 2022-02-03 MED ORDER — ALBUTEROL SULFATE HFA 108 (90 BASE) MCG/ACT IN AERS: 2.0000 | INHALATION_SPRAY | Freq: Four times a day (QID) | RESPIRATORY_TRACT | 5 refills | Status: DC | PRN

## 2022-02-03 NOTE — Telephone Encounter (Signed)
Medication sent to the pharmacy.

## 2022-02-03 NOTE — Telephone Encounter (Signed)
Encourage patient to contact the pharmacy for refills or they can request refills through MYCHART ? ?(Please schedule appointment if patient has not been seen in over a year) ? ? ? ?WHAT PHARMACY WOULD THEY LIKE THIS SENT TO: [id number] - [place], [place] - [address]  ? ?MEDICATION NAME & DOSE:albuterol (VENTOLIN HFA) 108 (90 Base) MCG/ACT inhaler  ? ?NOTES/COMMENTS FROM PATIENT: ? ? ? ? ? ?Front office please notify patient: ?It takes 48-72 hours to process rx refill requests ?Ask patient to call pharmacy to ensure rx is ready before heading there. + ?

## 2022-04-12 ENCOUNTER — Telehealth: Payer: Self-pay | Admitting: Registered Nurse

## 2022-04-12 ENCOUNTER — Other Ambulatory Visit: Payer: Self-pay

## 2022-04-12 DIAGNOSIS — J4520 Mild intermittent asthma, uncomplicated: Secondary | ICD-10-CM

## 2022-04-12 MED ORDER — ALBUTEROL SULFATE HFA 108 (90 BASE) MCG/ACT IN AERS: 2.0000 | INHALATION_SPRAY | Freq: Four times a day (QID) | RESPIRATORY_TRACT | 1 refills | Status: DC | PRN

## 2022-04-12 NOTE — Telephone Encounter (Signed)
Patient medication has been sent to the pharmacy ?

## 2022-04-12 NOTE — Telephone Encounter (Signed)
Pt called in asking for a refill on the Albuterol, pt states that he was already called the pharmacy and they told him he didn't have anymore refills on the medication. Pt uses [facility] in [place]

## 2022-04-28 ENCOUNTER — Other Ambulatory Visit: Payer: Self-pay | Admitting: Cardiology

## 2022-04-28 ENCOUNTER — Other Ambulatory Visit: Payer: Self-pay

## 2022-04-28 DIAGNOSIS — F411 Generalized anxiety disorder: Secondary | ICD-10-CM

## 2022-04-28 MED ORDER — ESCITALOPRAM OXALATE 20 MG PO TABS: 20.0000 mg | ORAL_TABLET | Freq: Every day | ORAL | 3 refills | Status: DC

## 2022-05-31 ENCOUNTER — Other Ambulatory Visit: Payer: Self-pay | Admitting: Registered Nurse

## 2022-05-31 DIAGNOSIS — J4520 Mild intermittent asthma, uncomplicated: Secondary | ICD-10-CM

## 2022-08-04 ENCOUNTER — Other Ambulatory Visit: Payer: Self-pay

## 2022-08-04 ENCOUNTER — Other Ambulatory Visit: Payer: Self-pay | Admitting: Cardiology

## 2022-08-04 DIAGNOSIS — J4520 Mild intermittent asthma, uncomplicated: Secondary | ICD-10-CM

## 2022-08-04 MED ORDER — ALBUTEROL SULFATE HFA 108 (90 BASE) MCG/ACT IN AERS: 2.0000 | INHALATION_SPRAY | Freq: Four times a day (QID) | RESPIRATORY_TRACT | 1 refills | Status: DC | PRN

## 2022-09-27 ENCOUNTER — Other Ambulatory Visit: Payer: Self-pay

## 2022-09-27 ENCOUNTER — Other Ambulatory Visit: Payer: Self-pay | Admitting: Family Medicine

## 2022-09-27 DIAGNOSIS — J4520 Mild intermittent asthma, uncomplicated: Secondary | ICD-10-CM

## 2022-09-27 MED ORDER — ALBUTEROL SULFATE HFA 108 (90 BASE) MCG/ACT IN AERS: 2.0000 | INHALATION_SPRAY | Freq: Four times a day (QID) | RESPIRATORY_TRACT | 1 refills | Status: DC | PRN

## 2022-10-31 ENCOUNTER — Other Ambulatory Visit: Payer: Self-pay

## 2022-11-15 ENCOUNTER — Ambulatory Visit: Payer: BC Managed Care – PPO | Admitting: Cardiology

## 2022-12-23 ENCOUNTER — Encounter: Payer: Self-pay | Admitting: Cardiology

## 2022-12-23 ENCOUNTER — Ambulatory Visit: Payer: No Typology Code available for payment source | Attending: Cardiology | Admitting: Cardiology

## 2022-12-23 VITALS — BP 104/62 | HR 80 | Ht 70.0 in | Wt 175.0 lb

## 2022-12-23 DIAGNOSIS — R002 Palpitations: Secondary | ICD-10-CM

## 2022-12-23 DIAGNOSIS — F419 Anxiety disorder, unspecified: Secondary | ICD-10-CM | POA: Diagnosis not present

## 2022-12-23 DIAGNOSIS — I471 Supraventricular tachycardia, unspecified: Secondary | ICD-10-CM

## 2022-12-23 NOTE — Progress Notes (Unsigned)
This visit was accompanied by Truddie Hidden, RN.

## 2022-12-23 NOTE — Patient Instructions (Addendum)
Medication Instructions:  Your physician recommends that you continue on your current medications as directed. Please refer to the Current Medication list given to you today.  *If you need a refill on your cardiac medications before your next appointment, please call your pharmacy*   Lab Work: None Ordered If you have labs (blood work) drawn today and your tests are completely normal, you will receive your results only by: [place] (if you have MyChart) OR A paper copy in the mail If you have any lab test that is abnormal or we need to change your treatment, we will call you to review the results.   Testing/Procedures: None Ordered   Follow-Up: At [facility], you and your health needs are our priority.  As part of our continuing mission to provide you with exceptional heart care, we have created designated Provider Care Teams.  These Care Teams include your primary Cardiologist (physician) and Advanced Practice Providers (APPs -  Physician Assistants and Nurse Practitioners) who all work together to provide you with the care you need, when you need it.  We recommend signing up for the patient portal called "MyChart".  Sign up information is provided on this After Visit Summary.  MyChart is used to connect with patients for Virtual Visits (Telemedicine).  Patients are able to view lab/test results, encounter notes, upcoming appointments, etc.  Non-urgent messages can be sent to your provider as well.   To learn more about what you can do with MyChart, go to [email or web address].    Your next appointment:   AS needed  The format for your next appointment:   In Person  Provider:   Jenne Campus, MD    Other Instructions This visit was accompanied by Truddie Hidden, RN.

## 2022-12-23 NOTE — Progress Notes (Unsigned)
Cardiology Office Note:    Date:  12/23/2022   ID:  Almyra Brace, DOB [birth year], MRN [id number]  PCP:  Maximiano Coss, NP  Cardiologist:  Jenne Campus, MD    Referring MD: Maximiano Coss, NP   Chief Complaint  Patient presents with   Follow-up    History of Present Illness:    Jordan Carroll is a 27 y.o. male with past medical history significant for supraventricular tachycardia, only various short episodes, palpitations, general anxiety disorder.  He was referred to [place] originally because of palpitations however workup revealed presence of some extrasystole rare as well as a short episode of supraventricular tachycardia.  He was given prescription for metoprolol that he took for a while but then his palpitation completely subsided and he stopped it he is doing very well.  He denies have any palpitation there is no chest pain tightness squeezing pressure burning chest doing well.  He runs some landscaping business very busy he said he is doing good manage and he is feeling well.  He does not drink coffee anymore he does not drink energy drinks anymore.  Past Medical History:  Diagnosis Date   Allergy    Anxiety    Concussion    Generalized anxiety disorder 01/22/2018   Peanut allergy 05/30/2017   Visit for preventive health examination 05/30/2017    Past Surgical History:  Procedure Laterality Date   NO PAST SURGERIES      Current Medications: Current Meds  Medication Sig   albuterol (VENTOLIN HFA) 108 (90 Base) MCG/ACT inhaler Inhale 2 puffs into the lungs every 6 (six) hours as needed for wheezing or shortness of breath.   EPINEPHrine 0.3 mg/0.3 mL IJ SOAJ injection Inject 0.3 mg into the muscle as needed for anaphylaxis.   hydrOXYzine (ATARAX/VISTARIL) 10 MG tablet Take 0.5-1 tablets (5-10 mg total) by mouth 3 (three) times daily as needed for anxiety.   loratadine (CLARITIN) 10 MG tablet Take 10 mg by mouth daily.   pantoprazole (PROTONIX) 40 MG tablet Take 1 tablet (40  mg total) by mouth daily.     Allergies:   Peanut oil, Fruit extracts, and Other   Social History   Socioeconomic History   Marital status: Single    Spouse name: Not on file   Number of children: Not on file   Years of education: Not on file   Highest education level: Not on file  Occupational History   Occupation: Student    Comment: [employer]  Tobacco Use   Smoking status: Former    Types: E-cigarettes    Quit date: 01/22/2018    Years since quitting: 4.9   Smokeless tobacco: Never  Vaping Use   Vaping Use: Never used  Substance and Sexual Activity   Alcohol use: Yes    Comment: occ   Drug use: Yes    Types: Marijuana   Sexual activity: Not on file  Other Topics Concern   Not on file  Social History Narrative   Not on file   Social Determinants of Health   Financial Resource Strain: Not on file  Food Insecurity: Not on file  Transportation Needs: Not on file  Physical Activity: Not on file  Stress: Not on file  Social Connections: Not on file     Family History: The patient's family history includes Cancer in his maternal grandfather; Healthy in his mother; Heart attack in his maternal grandmother; Hyperlipidemia in his father. ROS:   Please see the history of present illness.  All 14 point review of systems negative except as described per history of present illness  EKGs/Labs/Other Studies Reviewed:      Recent Labs: No results found for requested labs within last 365 days.  Recent Lipid Panel    Component Value Date/Time   CHOL 198 08/16/2021 0928   TRIG 78 08/16/2021 0928   HDL 66 08/16/2021 0928   CHOLHDL 3.0 08/16/2021 0928   LDLCALC 118 (H) 08/16/2021 0928    Physical Exam:    VS:  BP 104/62 (BP Location: Left Arm, Patient Position: Sitting)   Pulse 80   Ht 5' 10"$  (1.778 m)   Wt 175 lb (79.4 kg)   SpO2 94%   BMI 25.11 kg/m     Wt Readings from Last 3 Encounters:  12/23/22 175 lb (79.4 kg)  08/16/21 175 lb (79.4 kg)  04/16/21 156 lb  9.6 oz (71 kg)     GEN:  Well nourished, well developed in no acute distress HEENT: Normal NECK: No JVD; No carotid bruits LYMPHATICS: No lymphadenopathy CARDIAC: RRR, no murmurs, no rubs, no gallops RESPIRATORY:  Clear to auscultation without rales, wheezing or rhonchi  ABDOMEN: Soft, non-tender, non-distended MUSCULOSKELETAL:  No edema; No deformity  SKIN: Warm and dry LOWER EXTREMITIES: no swelling NEUROLOGIC:  Alert and oriented x 3 PSYCHIATRIC:  Normal affect   ASSESSMENT:    1. Palpitations   2. Supraventricular tachycardia   3. Anxiety    PLAN:    In order of problems listed above:  Supraventricular tachycardia/palpitations: Denies having any.  Stable off medication he does have metoprolol just in case that palpitation will start happening.  I will see him my office on as-needed basis if palpitation recur otherwise there will be no regular follow-up. Anxiety he is to take Lexapro but things got better and he stopped it doing very well he stopped Lexapro more than 7 months ago since that time doing well   Medication Adjustments/Labs and Tests Ordered: Current medicines are reviewed at length with the patient today.  Concerns regarding medicines are outlined above.  No orders of the defined types were placed in this encounter.  Medication changes: No orders of the defined types were placed in this encounter.   Signed, Park Liter, MD, [facility] 12/23/2022 9:55 AM    [place]

## 2023-01-16 ENCOUNTER — Encounter: Payer: Self-pay | Admitting: Family Medicine

## 2023-01-16 ENCOUNTER — Ambulatory Visit: Payer: No Typology Code available for payment source | Admitting: Family Medicine

## 2023-01-16 VITALS — BP 111/71 | HR 68 | Temp 98.2°F | Ht 70.0 in | Wt 174.2 lb

## 2023-01-16 DIAGNOSIS — F411 Generalized anxiety disorder: Secondary | ICD-10-CM | POA: Diagnosis not present

## 2023-01-16 DIAGNOSIS — T7840XS Allergy, unspecified, sequela: Secondary | ICD-10-CM | POA: Diagnosis not present

## 2023-01-16 DIAGNOSIS — J45909 Unspecified asthma, uncomplicated: Secondary | ICD-10-CM | POA: Insufficient documentation

## 2023-01-16 DIAGNOSIS — J4520 Mild intermittent asthma, uncomplicated: Secondary | ICD-10-CM | POA: Diagnosis not present

## 2023-01-16 DIAGNOSIS — Z91010 Allergy to peanuts: Secondary | ICD-10-CM | POA: Diagnosis not present

## 2023-01-16 MED ORDER — ALBUTEROL SULFATE HFA 108 (90 BASE) MCG/ACT IN AERS: 2.0000 | INHALATION_SPRAY | Freq: Four times a day (QID) | RESPIRATORY_TRACT | 0 refills | Status: DC | PRN

## 2023-01-16 MED ORDER — EPINEPHRINE 0.3 MG/0.3ML IJ SOAJ: 0.3000 mg | INTRAMUSCULAR | 0 refills | Status: DC | PRN

## 2023-01-16 MED ORDER — SERTRALINE HCL 50 MG PO TABS: 50.0000 mg | ORAL_TABLET | Freq: Every day | ORAL | 3 refills | Status: DC

## 2023-01-16 MED ORDER — HYDROXYZINE HCL 10 MG PO TABS: 5.0000 mg | ORAL_TABLET | Freq: Three times a day (TID) | ORAL | 5 refills | Status: DC | PRN

## 2023-01-16 NOTE — Assessment & Plan Note (Signed)
Will refill EpiPen.  No recent flares.

## 2023-01-16 NOTE — Assessment & Plan Note (Signed)
Overall stable on Claritin daily.

## 2023-01-16 NOTE — Assessment & Plan Note (Signed)
Symptoms are not controlled only.  No red Flags.  No reported SI or HI.  Currently using hydroxyzine 50 mg 3 times daily as needed.  We discussed treatment options including referral for therapy versus starting another SSRI.  He is agreeable to try alternative SSRI.  Does not have much success with Lexapro in the past.  Will start Zoloft 50 mg daily.  Discussed potential side effects.  He can follow-up with me in a couple weeks via MyChart and we can adjust the dose as needed.

## 2023-01-16 NOTE — Patient Instructions (Addendum)
It was very nice to see you today!  Will refill your medications today.  Will start Zoloft.   Please send me a message in a couple of weeks to let me know how you are doing.  Take care, Dr Jerline Pain  PLEASE NOTE:  If you had any lab tests, please let us know if you have not heard back within a few days. You may see your results on mychart before we have a chance to review them but we will give you a call once they are reviewed by [place].   If we ordered any referrals today, please let us know if you have not heard from their office within the next week.   If you had any urgent prescriptions sent in today, please check with the pharmacy within an hour of our visit to make sure the prescription was transmitted appropriately.   Please try these tips to maintain a healthy lifestyle:  Eat at least 3 REAL meals and 1-2 snacks per day.  Aim for no more than 5 hours between eating.  If you eat breakfast, please do so within one hour of getting up.   Each meal should contain half fruits/vegetables, one quarter protein, and one quarter carbs (no bigger than a computer mouse)  Cut down on sweet beverages. This includes juice, soda, and sweet tea.   Drink at least 1 glass of water with each meal and aim for at least 8 glasses per day  Exercise at least 150 minutes every week.

## 2023-01-16 NOTE — Assessment & Plan Note (Signed)
Symptoms are mild and intermittent.  Will refill his albuterol inhaler today.

## 2023-01-16 NOTE — Progress Notes (Signed)
Jordan Carroll is a 27 y.o. male who presents today for an office visit.  He is transferring care to this office.   Assessment/Plan:  Chronic Problems Addressed Today: Generalized anxiety disorder Symptoms are not controlled only.  No red Flags.  No reported SI or HI.  Currently using hydroxyzine 50 mg 3 times daily as needed.  We discussed treatment options including referral for therapy versus starting another SSRI.  He is agreeable to try alternative SSRI.  Does not have much success with Lexapro in the past.  Will start Zoloft 50 mg daily.  Discussed potential side effects.  He can follow-up with me in a couple weeks via MyChart and we can adjust the dose as needed.  Allergies Overall stable on Claritin daily.   Asthma Symptoms are mild and intermittent.  Will refill his albuterol inhaler today.  Peanut allergy Will refill EpiPen.  No recent flares.     Subjective:  HPI:  See A/p for status of chronic conditions. He is here to transfer care. His previous PCP moved about 6 months ago. He needs a refill on medications today.    He also does admit to having worsening anxiety over the last few months.  This is an ongoing issue and his previous PCPs have tried a few different medications. He is currently taking just hydroxyzine as needed.  This does seem to be working reasonably well though he does have worsening baseline anxiety especially around certain situations such as being around large groups of people.  No reported SI or HI.  Previously has been on Lexapro which worked well but felt like the effectiveness wore off relatively quickly.   ROS: Per HPI, otherwise a complete review of systems was negative.   PMH:  The following were reviewed and entered/updated in epic: Past Medical History:  Diagnosis Date   Allergy    Anxiety    Concussion    Generalized anxiety disorder 01/22/2018   Peanut allergy 05/30/2017   Visit for preventive health examination 05/30/2017   Patient  Active Problem List   Diagnosis Date Noted   Asthma 01/16/2023   Allergies 05/21/2021   Generalized anxiety disorder 01/22/2018   Peanut allergy 05/30/2017   Past Surgical History:  Procedure Laterality Date   NO PAST SURGERIES      Family History  Problem Relation Age of Onset   Healthy Mother    Hyperlipidemia Father    Heart attack Maternal Grandmother    Cancer Maternal Grandfather        Leukemia    Medications- reviewed and updated Current Outpatient Medications  Medication Sig Dispense Refill   albuterol (VENTOLIN HFA) 108 (90 Base) MCG/ACT inhaler Inhale 2 puffs into the lungs every 6 (six) hours as needed for wheezing or shortness of breath. 18 g 0   loratadine (CLARITIN) 10 MG tablet Take 10 mg by mouth daily.     sertraline (ZOLOFT) 50 MG tablet Take 1 tablet (50 mg total) by mouth daily. 30 tablet 3   EPINEPHrine 0.3 mg/0.3 mL IJ SOAJ injection Inject 0.3 mg into the muscle as needed for anaphylaxis. 1 each 0   hydrOXYzine (ATARAX) 10 MG tablet Take 0.5-1 tablets (5-10 mg total) by mouth 3 (three) times daily as needed for anxiety. 30 tablet 5   No current facility-administered medications for this visit.    Allergies-reviewed and updated Allergies  Allergen Reactions   Peanut Oil Anaphylaxis    All tree nuts   Fruit Extracts Other (See Comments)  Itching swollen troat   Other Other (See Comments)    Animal saliva, tree nuts-unknown reaction    Social History   Socioeconomic History   Marital status: Single    Spouse name: Not on file   Number of children: Not on file   Years of education: Not on file   Highest education level: Not on file  Occupational History   Occupation: Student    Comment: [employer]  Tobacco Use   Smoking status: Former    Types: E-cigarettes    Quit date: 01/22/2018    Years since quitting: 4.9   Smokeless tobacco: Never  Vaping Use   Vaping Use: Never used  Substance and Sexual Activity   Alcohol use: Yes    Comment: occ    Drug use: Yes    Types: Marijuana   Sexual activity: Not on file  Other Topics Concern   Not on file  Social History Narrative   Not on file   Social Determinants of Health   Financial Resource Strain: Not on file  Food Insecurity: Not on file  Transportation Needs: Not on file  Physical Activity: Not on file  Stress: Not on file  Social Connections: Not on file          Objective:  Physical Exam: BP 111/71   Pulse 68   Temp 98.2 F (36.8 C) (Temporal)   Ht '5\' 10"'$  (1.778 m)   Wt 174 lb 3.2 oz (79 kg)   SpO2 98%   BMI 25.00 kg/m   Gen: No acute distress, resting comfortably CV: Regular rate and rhythm with no murmurs appreciated Pulm: Normal work of breathing, clear to auscultation bilaterally with no crackles, wheezes, or rhonchi Neuro: Grossly normal, moves all extremities Psych: Normal affect and thought content      Makenna Macaluso M. Jerline Pain, MD 01/16/2023 2:26 PM

## 2023-02-09 ENCOUNTER — Other Ambulatory Visit: Payer: Self-pay | Admitting: Family Medicine

## 2023-02-15 ENCOUNTER — Other Ambulatory Visit: Payer: Self-pay | Admitting: Cardiology

## 2023-03-12 ENCOUNTER — Other Ambulatory Visit: Payer: Self-pay | Admitting: Family Medicine

## 2023-04-12 ENCOUNTER — Other Ambulatory Visit: Payer: Self-pay | Admitting: Family Medicine

## 2023-04-21 ENCOUNTER — Other Ambulatory Visit: Payer: Self-pay | Admitting: Family Medicine

## 2023-04-21 DIAGNOSIS — F411 Generalized anxiety disorder: Secondary | ICD-10-CM

## 2023-05-01 ENCOUNTER — Encounter: Payer: Self-pay | Admitting: Family Medicine

## 2023-05-01 ENCOUNTER — Ambulatory Visit (INDEPENDENT_AMBULATORY_CARE_PROVIDER_SITE_OTHER): Payer: No Typology Code available for payment source | Admitting: Family Medicine

## 2023-05-01 VITALS — BP 113/76 | HR 67 | Temp 97.5°F | Ht 70.0 in | Wt 170.4 lb

## 2023-05-01 DIAGNOSIS — R42 Dizziness and giddiness: Secondary | ICD-10-CM | POA: Diagnosis not present

## 2023-05-01 DIAGNOSIS — Z23 Encounter for immunization: Secondary | ICD-10-CM

## 2023-05-01 DIAGNOSIS — F411 Generalized anxiety disorder: Secondary | ICD-10-CM | POA: Diagnosis not present

## 2023-05-01 DIAGNOSIS — I471 Supraventricular tachycardia, unspecified: Secondary | ICD-10-CM

## 2023-05-01 LAB — COMPREHENSIVE METABOLIC PANEL
ALT: 21 U/L (ref 0–53)
AST: 21 U/L (ref 0–37)
Albumin: 4.8 g/dL (ref 3.5–5.2)
Alkaline Phosphatase: 85 U/L (ref 39–117)
BUN: 11 mg/dL (ref 6–23)
CO2: 28 mEq/L (ref 19–32)
Calcium: 9.8 mg/dL (ref 8.4–10.5)
Chloride: 101 mEq/L (ref 96–112)
Creatinine, Ser: 1.01 mg/dL (ref 0.40–1.50)
GFR: 103.72 mL/min (ref >60.00)
Glucose, Bld: 94 mg/dL (ref 70–99)
Potassium: 4.5 mEq/L (ref 3.5–5.1)
Sodium: 137 mEq/L (ref 135–145)
Total Bilirubin: 1 mg/dL (ref 0.2–1.2)
Total Protein: 7.6 g/dL (ref 6.0–8.3)

## 2023-05-01 LAB — TSH: TSH: 1.97 u[IU]/mL (ref 0.35–5.50)

## 2023-05-01 LAB — CBC
HCT: 46.2 % (ref 39.0–52.0)
Hemoglobin: 15.6 g/dL (ref 13.0–17.0)
MCHC: 33.7 g/dL (ref 30.0–36.0)
MCV: 89.2 fl (ref 78.0–100.0)
Platelets: 312 10*3/uL (ref 150.0–400.0)
RBC: 5.18 Mil/uL (ref 4.22–5.81)
RDW: 13.1 % (ref 11.5–15.5)
WBC: 7.2 10*3/uL (ref 4.0–10.5)

## 2023-05-01 NOTE — Assessment & Plan Note (Signed)
Has had cardiac workup in the past and follows with cardiology routinely for this.  Not currently on any medications.

## 2023-05-01 NOTE — Progress Notes (Signed)
Jordan Carroll is a 27 y.o. male who presents today for an office visit.  Assessment/Plan:  New/Acute Problems: Dizziness Likely multifactorial.  He does have mildly positive Dix-Hallpike today and some lateral nystagmus on exam.  Likely has some mild underlying BPPV.  Does have quite a bit of annoying anxiety which is likely contributing as well.  Given his positional symptoms and exam findings would be reasonable for him to see vestibular rehab specialist at this point to work on Epley maneuvers.  Will also check labs today including CBC, c-Met, and TSH to rule out other possible causes.  We are addressing his anxiety as below.  If symptoms do not improve with above would consider referral to ENT.  Chronic Problems Addressed Today: Generalized anxiety disorder Has a lengty discussion with patient today.  He is using hydroxyzine 50 mg 3 times daily which does help with his dizziness as well.  Also discussed that his anxiety is probably exacerbating some of the symptoms he is getting around his dizziness episodes.  He is agreeable to try Zoloft though prescribed at our last office visit.  We did discuss potential side effects.  He will follow-up with me in a few weeks via MyChart and we can titrate the dose as needed.  Paroxysmal SVT (supraventricular tachycardia) Has had cardiac workup in the past and follows with cardiology routinely for this.  Not currently on any medications.     Subjective:  HPI:  See A/P for status of chronic conditions.  Patient is here today for follow-up.  We saw him 3 months ago at his transfer of care visit.  At that time we did discuss anxiety treatment and started Zoloft 50 mg daily and continued him on hydroxyzine 50 mg 3 times daily as needed.  His main concern today is dizziness. This has been going for about a week or two. Sometimes feels like he can't feel his arms or face. Feels like going to pass. No syncopal. This is happening multiple times per day. Lasts  for about a few minutes but can sometimes last for about 20 minutes. Longest was about an hour. Usually gets better after resting. He has tried taking hydoxyzine which helps. Sometimes gets a room spinning sensation. Better if not moving. Some numbness. No fevers or chills. No recent illnesses. Nothing like this in the past.  Symptoms sometimes occur when moving his head to the right or left.  He has been diagnosed with SVT in the past and has been seen by cardiology for this previously.  Previously on metoprolol which he discontinued several months ago.  Still has occasional palpitations but she got some anxiety.  Patient does tell me that he never started the Zoloft.  He was concerned about possible side effects.       Objective:  Physical Exam: BP 113/76   Pulse 67   Temp (!) 97.5 F (36.4 C) (Temporal)   Ht 5\' 10"  (1.778 m)   Wt 170 lb 6.4 oz (77.3 kg)   SpO2 97%   BMI 24.45 kg/m   Gen: No acute distress, resting comfortably HEENT: TMs clear. CV: Regular rate and rhythm with no murmurs appreciated Pulm: Normal work of breathing, clear to auscultation bilaterally with no crackles, wheezes, or rhonchi Neuro: Cranial nerves II through XII intact though does have bilateral lateral nystagmus with EOMI. Dix-Hallpike exacerbates some symptoms on right.  Negative on the left.  Sensation light touch intact throughout. Psych: Normal affect and thought content  Katina Degree. Jordan Ralph, MD 05/01/2023 2:08 PM

## 2023-05-01 NOTE — Assessment & Plan Note (Addendum)
Has a lengty discussion with patient today.  He is using hydroxyzine 50 mg 3 times daily which does help with his dizziness as well.  Also discussed that his anxiety is probably exacerbating some of the symptoms he is getting around his dizziness episodes.  He is agreeable to try Zoloft though prescribed at our last office visit.  We did discuss potential side effects.  He will follow-up with me in a few weeks via MyChart and we can titrate the dose as needed.

## 2023-05-01 NOTE — Patient Instructions (Addendum)
It was very nice to see you today!  You may have some vertigo causing your symptoms.  Your anxiety is probably contributing as well.  We will check blood work to make sure there is nothing else going on.  Please start the Zoloft.  Return in about 2 weeks (around 05/15/2023).   Take care, Dr Jimmey Ralph  PLEASE NOTE:  If you had any lab tests, please let us know if you have not heard back within a few days. You may see your results on mychart before we have a chance to review them but we will give you a call once they are reviewed by [place].   If we ordered any referrals today, please let us know if you have not heard from their office within the next week.   If you had any urgent prescriptions sent in today, please check with the pharmacy within an hour of our visit to make sure the prescription was transmitted appropriately.   Please try these tips to maintain a healthy lifestyle:  Eat at least 3 REAL meals and 1-2 snacks per day.  Aim for no more than 5 hours between eating.  If you eat breakfast, please do so within one hour of getting up.   Each meal should contain half fruits/vegetables, one quarter protein, and one quarter carbs (no bigger than a computer mouse)  Cut down on sweet beverages. This includes juice, soda, and sweet tea.   Drink at least 1 glass of water with each meal and aim for at least 8 glasses per day  Exercise at least 150 minutes every week.

## 2023-05-02 NOTE — Progress Notes (Signed)
Great news!  Labs are all normal.  As we discussed at his office visit I believe his dizziness is probably coming from a combination of the vertigo and his anxiety.  I would like for him to check in with [place] in a week or 2 to let us know how he is doing however he should let us know if he has any worsening symptoms or any other changes.

## 2023-05-14 ENCOUNTER — Other Ambulatory Visit: Payer: Self-pay | Admitting: Family Medicine

## 2023-05-15 ENCOUNTER — Ambulatory Visit (INDEPENDENT_AMBULATORY_CARE_PROVIDER_SITE_OTHER): Payer: No Typology Code available for payment source | Admitting: Family Medicine

## 2023-05-15 ENCOUNTER — Encounter: Payer: Self-pay | Admitting: Family Medicine

## 2023-05-15 VITALS — BP 110/76 | HR 61 | Temp 98.2°F | Ht 70.0 in | Wt 171.2 lb

## 2023-05-15 DIAGNOSIS — J4520 Mild intermittent asthma, uncomplicated: Secondary | ICD-10-CM

## 2023-05-15 DIAGNOSIS — R42 Dizziness and giddiness: Secondary | ICD-10-CM

## 2023-05-15 DIAGNOSIS — T7840XS Allergy, unspecified, sequela: Secondary | ICD-10-CM | POA: Diagnosis not present

## 2023-05-15 DIAGNOSIS — F411 Generalized anxiety disorder: Secondary | ICD-10-CM | POA: Diagnosis not present

## 2023-05-15 MED ORDER — SERTRALINE HCL 50 MG PO TABS: 50.0000 mg | ORAL_TABLET | Freq: Every day | ORAL | 3 refills | Status: AC

## 2023-05-15 MED ORDER — ALBUTEROL SULFATE HFA 108 (90 BASE) MCG/ACT IN AERS: 2.0000 | INHALATION_SPRAY | Freq: Four times a day (QID) | RESPIRATORY_TRACT | 0 refills | Status: DC | PRN

## 2023-05-15 NOTE — Assessment & Plan Note (Signed)
Stable on albuterol as needed.  Will refill today. 

## 2023-05-15 NOTE — Assessment & Plan Note (Signed)
Doing much better on Zoloft 50 mg daily.  Tolerating well.  Will continue current dose for now given that he has had significant improvement.  We will follow-up again in 6 to 12 months at next office visit though we did discuss reasons to return to care.  Discussed with patient he should probably be on this for at least 6 to 12 months but can continue for longer as needed.

## 2023-05-15 NOTE — Progress Notes (Signed)
   Jordan Carroll is a 27 y.o. male who presents today for an office visit.  Assessment/Plan:  Chronic Problems Addressed Today: Generalized anxiety disorder Doing much better on Zoloft 50 mg daily.  Tolerating well.  Will continue current dose for now given that he has had significant improvement.  We will follow-up again in 6 to 12 months at next office visit though we did discuss reasons to return to care.  Discussed with patient he should probably be on this for at least 6 to 12 months but can continue for longer as needed.  Allergies Stable on claritin 10 mg daily.   Asthma Stable on albuterol as needed. Will refill today.   Vertigo Still has a lot of vertigo symptoms but has improved the last couple of weeks.  Will be seeing vestibular rehab in a couple of weeks for Epley maneuver training.    Subjective:  HPI:  See A/P for status of chronic conditions.  Patient here today for follow-up.  Saw him a couple of weeks ago for dizziness and anxiety.  At this visit previous to this we had prescribed Zoloft however he had not yet started but he was agreeable to start this at her last office visit.  For his dizziness we had referred him to see vestibular rehab specialist.  He has done very well the last couple of weeks.  Tolerating Zoloft well.  Did have some GI issues but this is manageable and does seem to be getting better.       Objective:  Physical Exam: BP 110/76 (BP Location: Left Arm, Patient Position: Sitting, Cuff Size: Normal)   Pulse 61   Temp 98.2 F (36.8 C) (Temporal)   Ht 5\' 10"  (1.778 m)   Wt 171 lb 4 oz (77.7 kg)   SpO2 99%   BMI 24.57 kg/m   Gen: No acute distress, resting comfortably Neuro: Grossly normal, moves all extremities Psych: Normal affect and thought content      Jordan Cretella M. Jimmey Ralph, MD 05/15/2023 2:03 PM

## 2023-05-15 NOTE — Assessment & Plan Note (Signed)
Stable on claritin 10 mg daily.

## 2023-05-15 NOTE — Assessment & Plan Note (Signed)
Still has a lot of vertigo symptoms but has improved the last couple of weeks.  Will be seeing vestibular rehab in a couple of weeks for Epley maneuver training.

## 2023-05-15 NOTE — Patient Instructions (Addendum)
It was very nice to see you today!  I am glad that you are doing better!  I will refill your medications.  Please let us know if any new issues arise.  Return in about 1 year (around 05/14/2024) for Annual Physical.   Take care, Dr Jimmey Ralph  PLEASE NOTE:  If you had any lab tests, please let us know if you have not heard back within a few days. You may see your results on mychart before we have a chance to review them but we will give you a call once they are reviewed by [place].   If we ordered any referrals today, please let us know if you have not heard from their office within the next week.   If you had any urgent prescriptions sent in today, please check with the pharmacy within an hour of our visit to make sure the prescription was transmitted appropriately.   Please try these tips to maintain a healthy lifestyle:  Eat at least 3 REAL meals and 1-2 snacks per day.  Aim for no more than 5 hours between eating.  If you eat breakfast, please do so within one hour of getting up.   Each meal should contain half fruits/vegetables, one quarter protein, and one quarter carbs (no bigger than a computer mouse)  Cut down on sweet beverages. This includes juice, soda, and sweet tea.   Drink at least 1 glass of water with each meal and aim for at least 8 glasses per day  Exercise at least 150 minutes every week.

## 2023-05-29 ENCOUNTER — Ambulatory Visit: Payer: No Typology Code available for payment source | Attending: Family Medicine | Admitting: Physical Therapy

## 2023-06-25 ENCOUNTER — Other Ambulatory Visit: Payer: Self-pay | Admitting: Family Medicine

## 2023-07-19 ENCOUNTER — Other Ambulatory Visit: Payer: Self-pay | Admitting: Family Medicine

## 2023-08-09 ENCOUNTER — Other Ambulatory Visit: Payer: Self-pay | Admitting: Family Medicine

## 2023-09-26 ENCOUNTER — Other Ambulatory Visit: Payer: Self-pay | Admitting: Family Medicine

## 2023-11-19 ENCOUNTER — Other Ambulatory Visit: Payer: Self-pay | Admitting: Family Medicine

## 2024-01-07 ENCOUNTER — Other Ambulatory Visit: Payer: Self-pay | Admitting: Family Medicine

## 2024-02-28 ENCOUNTER — Other Ambulatory Visit: Payer: Self-pay | Admitting: *Deleted

## 2024-02-28 MED ORDER — ALBUTEROL SULFATE HFA 108 (90 BASE) MCG/ACT IN AERS: 2.0000 | INHALATION_SPRAY | Freq: Four times a day (QID) | RESPIRATORY_TRACT | 1 refills | Status: DC | PRN

## 2024-05-14 ENCOUNTER — Emergency Department (HOSPITAL_BASED_OUTPATIENT_CLINIC_OR_DEPARTMENT_OTHER)
Admission: EM | Admit: 2024-05-14 | Discharge: 2024-05-14 | Disposition: A | Discharge status: Home / Self Care | Attending: Emergency Medicine | Admitting: Emergency Medicine

## 2024-05-14 ENCOUNTER — Other Ambulatory Visit: Payer: Self-pay

## 2024-05-14 ENCOUNTER — Encounter (HOSPITAL_BASED_OUTPATIENT_CLINIC_OR_DEPARTMENT_OTHER): Payer: Self-pay | Admitting: Emergency Medicine

## 2024-05-14 DIAGNOSIS — T7840XA Allergy, unspecified, initial encounter: Secondary | ICD-10-CM | POA: Diagnosis not present

## 2024-05-14 DIAGNOSIS — Z91010 Allergy to peanuts: Secondary | ICD-10-CM | POA: Diagnosis not present

## 2024-05-14 MED ORDER — PREDNISONE 20 MG PO TABS
40.0000 mg | ORAL_TABLET | Freq: Every day | ORAL | 0 refills | Status: AC
Start: 2024-05-15 — End: ?

## 2024-05-14 MED ORDER — DIPHENHYDRAMINE HCL 25 MG PO CAPS
50.0000 mg | ORAL_CAPSULE | Freq: Once | ORAL | Status: AC
Start: 2024-05-14 — End: 2024-05-14
  Administered 2024-05-14: 50 mg via ORAL
  Filled 2024-05-14: qty 2

## 2024-05-14 MED ORDER — PREDNISONE 20 MG PO TABS
40.0000 mg | ORAL_TABLET | Freq: Once | ORAL | Status: AC
Start: 2024-05-14 — End: 2024-05-14
  Administered 2024-05-14: 40 mg via ORAL
  Filled 2024-05-14: qty 2

## 2024-05-14 MED ORDER — EPINEPHRINE 0.3 MG/0.3ML IJ SOAJ: 0.3000 mg | INTRAMUSCULAR | 0 refills | Status: AC | PRN

## 2024-05-14 MED ORDER — FAMOTIDINE 20 MG PO TABS
20.0000 mg | ORAL_TABLET | Freq: Once | ORAL | Status: AC
  Administered 2024-05-14: 20 mg via ORAL
  Filled 2024-05-14: qty 1

## 2024-05-14 NOTE — ED Provider Notes (Signed)
 [place] EMERGENCY DEPARTMENT AT [facility] Provider Note   CSN: [id number] Arrival date & time: 05/14/24  1116     Patient presents with: Allergic Reaction   Jordan Carroll is a 28 y.o. male.   Patient presents to the emergency department after sustaining approximately 5 or 6 bee stings just prior to 11 AM.  Patient has a history of significant reactions to foods.  He has had prolonged reactions to bee stings before.  No treatments prior to arrival.  Patient denies facial, lip, or tongue swelling.  No itchiness in the throat.  No breathing difficulties.  He initially felt lightheaded but did not pass out.  No vomiting or diarrhea.  Patient has localized redness, no distributed hives.       Prior to Admission medications   Medication Sig Start Date End Date Taking? Authorizing Provider  albuterol  (VENTOLIN  HFA) 108 (90 Base) MCG/ACT inhaler Inhale 2 puffs into the lungs every 6 (six) hours as needed for wheezing or shortness of breath. 02/28/24   Kennyth Worth HERO, MD  EPINEPHrine  0.3 mg/0.3 mL IJ SOAJ injection Inject 0.3 mg into the muscle as needed for anaphylaxis. 01/16/23   Kennyth Worth HERO, MD  hydrOXYzine  (ATARAX ) 10 MG tablet TAKE 1/2 TO 1 TABLET(5 TO 10 MG) BY MOUTH THREE TIMES DAILY AS NEEDED FOR ANXIETY 04/21/23   Kennyth Worth HERO, MD  loratadine (CLARITIN) 10 MG tablet Take 10 mg by mouth daily.    [provider]  sertraline  (ZOLOFT ) 50 MG tablet Take 1 tablet (50 mg total) by mouth daily. 05/15/23   Kennyth Worth HERO, MD    Allergies: Peanut oil, Fruit extracts, and Other    Review of Systems  Updated Vital Signs BP 119/78 (BP Location: Left Arm)   Pulse 78   Temp 98 F (36.7 C)   Resp 19   SpO2 99%   Physical Exam Vitals and nursing note reviewed.  Constitutional:      Appearance: He is well-developed.  HENT:     Head: Normocephalic and atraumatic.     Mouth/Throat:     Comments: No angioedema  Eyes:     Conjunctiva/sclera: Conjunctivae normal.    Pulmonary:     Effort: No respiratory distress.     Comments: Lungs are clear.  Musculoskeletal:     Cervical back: Normal range of motion and neck supple.   Skin:    General: Skin is warm and dry.     Comments: Patient with several bee stings noted, right arm, legs.  Approximately quarter size area of erythema and swelling around each sting.  I do not visualize any stingers in the skin.  No generalized hives.   Neurological:     Mental Status: He is alert.     (all labs ordered are listed, but only abnormal results are displayed) Labs Reviewed - No data to display  EKG: None  Radiology: No results found.   Procedures   Medications Ordered in the ED - No data to display   ED Course  Patient seen and examined. History obtained directly from patient.   Labs/EKG: None ordered  Imaging: None ordered  Medications/Fluids: P.o. 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  Will monitor.  12:46 PM Reassessment performed. Patient appears stable.  He has not developed any hives or airway symptoms.  Most current vital signs reviewed and are as follows: BP 119/78 (BP Location: Left Arm)   Pulse 78   Temp 98 F (36.7 C)   Resp 19   SpO2 99%   Plan: Discharge to home.   Prescriptions written for: Prednisone  x 3 days, EpiPen   Other home care instructions discussed: Continued histamine blockade with loratadine in the morning, Benadryl at night.  ED return instructions discussed: No worsening symptoms  Follow-up instructions discussed: Patient encouraged to follow-up with their PCP as needed.                                 Medical Decision Making Risk Prescription drug  management.   Patient presents for localized allergic reactions after being stung by bees approximately 5 times.  No evidence of anaphylaxis.  Patient has had mild anaphylaxis to other exposures before.  Patient stable during ED stay.  Will continue treatment at home as above.  No indication for further monitoring at this time.  Routine care indicated.     Final diagnoses:  Allergic reaction, initial encounter    ED Discharge Orders          Ordered    predniSONE  (DELTASONE ) 20 MG tablet  Daily        05/14/24 1245    EPINEPHrine  0.3 mg/0.3 mL IJ SOAJ injection  As needed        05/14/24 1245               Desiderio Chew, PA-C 05/14/24 1248    Cottie Donnice PARAS, MD 05/14/24 1356

## 2024-05-14 NOTE — Discharge Instructions (Signed)
 Please read and follow all provided instructions.  Your diagnoses today include:  1. Allergic reaction, initial encounter    Tests performed today include: Vital signs. See below for your results today.   Medications prescribed:  Epi-pen Inject into thigh as directed if you have a severe reaction that causes throat swelling or any trouble breathing. Call 9-1-1 immediately if you use an Epi-pen. You should be evaluated at a hospital as soon as possible.    Prednisone  - steroid medicine   It is best to take this medication in the morning to prevent sleeping problems. If you are diabetic, monitor your blood sugar closely and stop taking Prednisone  if blood sugar is over 300. Take with food to prevent stomach upset.   Take any prescribed medications only as directed.  Home care instructions:  Follow any educational materials contained in this packet You may take over-the-counter loratadine in the morning Benadryl at bedtime to help continue to control allergic reaction.  Follow-up instructions: Please follow-up with your primary care provider as needed for further evaluation of your symptoms.   Return instructions:  Please return to the Emergency Department if you experience worsening symptoms.  Call 9-1-1 immediately if you have an allergic reaction that involves your lips, mouth, throat or if you have any difficulty breathing. This is a life-threatening emergency.  Please return if you have any other emergent concerns.  Additional Information:  Your vital signs today were: BP 119/78 (BP Location: Left Arm)   Pulse 78   Temp 98 F (36.7 C)   Resp 19   SpO2 99%  If your blood pressure (BP) was elevated above 135/85 this visit, please have this repeated by your doctor within one month. --------------

## 2024-05-14 NOTE — ED Triage Notes (Signed)
 Was stung multiple times in hands and legs approx 30 mins pta. States hx of reactions but never has had to use epi pen. No obvious distress.

## 2024-05-21 ENCOUNTER — Encounter: Payer: No Typology Code available for payment source | Admitting: Family Medicine

## 2024-05-22 ENCOUNTER — Telehealth: Payer: Self-pay | Admitting: Family Medicine

## 2024-05-22 MED ORDER — ALBUTEROL SULFATE HFA 108 (90 BASE) MCG/ACT IN AERS: 2.0000 | INHALATION_SPRAY | Freq: Four times a day (QID) | RESPIRATORY_TRACT | 0 refills | Status: DC | PRN

## 2024-05-22 NOTE — Telephone Encounter (Signed)
 Copied from CRM [id number]. Topic: Clinical - Medication Refill >> May 22, 2024  2:58 PM Martinique E wrote: Medication: albuterol  (VENTOLIN  HFA) 108 (90 Base) MCG/ACT inhaler  Has the patient contacted their pharmacy? Yes, [facility] called in for the refill. (Agent: If no, request that the patient contact the pharmacy for the refill. If patient does not wish to contact the pharmacy document the reason why and proceed with request.) (Agent: If yes, when and what did the pharmacy advise?)  This is the patient's preferred pharmacy:  [id number] - [place], [place] - [address] [place] [address] Phone: [phone] Fax: [phone]   Is this the correct pharmacy for this prescription? Yes If no, delete pharmacy and type the correct one.   Has the prescription been filled recently? No  Is the patient out of the medication? Yes  Has the patient been seen for an appointment in the last year OR does the patient have an upcoming appointment? No  Can we respond through MyChart? Yes  Agent: Please be advised that Rx refills may take up to 3 business days. We ask that you follow-up with your pharmacy.

## 2024-06-20 ENCOUNTER — Telehealth: Payer: Self-pay | Admitting: Family Medicine

## 2024-06-20 ENCOUNTER — Other Ambulatory Visit: Payer: Self-pay

## 2024-06-20 NOTE — Telephone Encounter (Unsigned)
 Copied from CRM [id number]. Topic: Clinical - Medication Refill >> Jun 20, 2024  3:15 PM Shereese L wrote: Medication: albuterol  (VENTOLIN  HFA) 108 (90 Base) MCG/ACT inhaler  Has the patient contacted their pharmacy? Yes (Agent: If no, request that the patient contact the pharmacy for the refill. If patient does not wish to contact the pharmacy document the reason why and proceed with request.) (Agent: If yes, when and what did the pharmacy advise?)  This is the patient's preferred pharmacy:  [id number] - [place], [place] - [address] [place] [address] Phone: [phone] Fax: [phone]   Is this the correct pharmacy for this prescription? Yes If no, delete pharmacy and type the correct one.   Has the prescription been filled recently? Yes  Is the patient out of the medication? Yes  Has the patient been seen for an appointment in the last year OR does the patient have an upcoming appointment? Yes  Can we respond through MyChart? Yes  Agent: Please be advised that Rx refills may take up to 3 business days. We ask that you follow-up with your pharmacy.

## 2024-06-20 NOTE — Telephone Encounter (Signed)
 Copied from CRM [id number]. Topic: Clinical - Medication Refill >> Jun 20, 2024  3:15 PM Shereese L wrote: Medication: albuterol  (VENTOLIN  HFA) 108 (90 Base) MCG/ACT inhaler  Has the patient contacted their pharmacy? Yes (Agent: If no, request that the patient contact the pharmacy for the refill. If patient does not wish to contact the pharmacy document the reason why and proceed with request.) (Agent: If yes, when and what did the pharmacy advise?)  This is the patient's preferred pharmacy:  [id number] - [place], [place] - [id number] [place] [address] Phone: [phone] Fax: [phone]   Is this the correct pharmacy for this prescription? Yes If no, delete pharmacy and type the correct one.   Has the prescription been filled recently? Yes  Is the patient out of the medication? Yes  Has the patient been seen for an appointment in the last year OR does the patient have an upcoming appointment? Yes  Can we respond through MyChart? Yes  Agent: Please be advised that Rx refills may take up to 3 business days. We ask that you follow-up with your pharmacy.

## 2024-07-08 ENCOUNTER — Ambulatory Visit: Admitting: Family Medicine

## 2024-07-09 ENCOUNTER — Other Ambulatory Visit: Payer: Self-pay | Admitting: *Deleted

## 2024-07-09 MED ORDER — ALBUTEROL SULFATE HFA 108 (90 BASE) MCG/ACT IN AERS: 2.0000 | INHALATION_SPRAY | Freq: Four times a day (QID) | RESPIRATORY_TRACT | 0 refills | Status: AC | PRN

## 2024-08-09 ENCOUNTER — Other Ambulatory Visit: Payer: Self-pay | Admitting: Family Medicine

## 2024-09-23 ENCOUNTER — Other Ambulatory Visit: Payer: Self-pay | Admitting: Family Medicine
